# Patient Record
Sex: Female | Born: 1960 | Hispanic: No | Marital: Married | Smoking: Never smoker
Health system: Southern US, Community
[De-identification: ages and names within clinical notes are randomized; demographics above are authoritative.]

## PROBLEM LIST (undated history)

## (undated) DIAGNOSIS — I639 Cerebral infarction, unspecified: Secondary | ICD-10-CM

## (undated) DIAGNOSIS — E7800 Pure hypercholesterolemia, unspecified: Secondary | ICD-10-CM

## (undated) DIAGNOSIS — I10 Essential (primary) hypertension: Secondary | ICD-10-CM

## (undated) HISTORY — DX: Essential (primary) hypertension: I10

## (undated) HISTORY — DX: Cerebral infarction, unspecified: I63.9

## (undated) HISTORY — DX: Pure hypercholesterolemia, unspecified: E78.00

---

## 2017-09-29 LAB — HM PAP SMEAR: HM Pap smear: NEGATIVE

## 2018-10-24 LAB — HM MAMMOGRAPHY

## 2019-06-23 ENCOUNTER — Other Ambulatory Visit: Payer: Self-pay

## 2019-06-23 ENCOUNTER — Encounter: Payer: Self-pay | Admitting: Osteopathic Medicine

## 2019-06-23 ENCOUNTER — Ambulatory Visit (INDEPENDENT_AMBULATORY_CARE_PROVIDER_SITE_OTHER): Payer: BLUE CROSS/BLUE SHIELD | Admitting: Osteopathic Medicine

## 2019-06-23 VITALS — BP 139/74 | HR 73 | Temp 98.3°F | Ht <= 58 in | Wt 136.8 lb

## 2019-06-23 DIAGNOSIS — Z0000 Encounter for general adult medical examination without abnormal findings: Secondary | ICD-10-CM | POA: Diagnosis not present

## 2019-06-23 DIAGNOSIS — E782 Mixed hyperlipidemia: Secondary | ICD-10-CM | POA: Diagnosis not present

## 2019-06-23 DIAGNOSIS — R0781 Pleurodynia: Secondary | ICD-10-CM

## 2019-06-23 DIAGNOSIS — Z87898 Personal history of other specified conditions: Secondary | ICD-10-CM

## 2019-06-23 DIAGNOSIS — I517 Cardiomegaly: Secondary | ICD-10-CM

## 2019-06-23 DIAGNOSIS — I10 Essential (primary) hypertension: Secondary | ICD-10-CM | POA: Diagnosis not present

## 2019-06-23 MED ORDER — SIMVASTATIN 20 MG PO TABS: 20.0000 mg | ORAL_TABLET | Freq: Every day | ORAL | 3 refills | Status: DC

## 2019-06-23 MED ORDER — NAPROXEN 500 MG PO TABS: 500.0000 mg | ORAL_TABLET | Freq: Two times a day (BID) | ORAL | 1 refills | Status: DC

## 2019-06-23 MED ORDER — LISINOPRIL-HYDROCHLOROTHIAZIDE 20-12.5 MG PO TABS: 2.0000 | ORAL_TABLET | Freq: Every day | ORAL | 3 refills | Status: DC

## 2019-06-23 NOTE — Progress Notes (Signed)
HPI: Claire Marshall is a 58 y.o. female who  has a past medical history of High cholesterol and Hypertension.  she presents to [facility] today, 06/23/19,  for chief complaint of: New to establish care   HTN: Forgot medications today BP a bit above goal! Better on recheck 139/74 BP 10/03/18 per records was 150/80 and no changes made to meds (lisinopril-hct 20-12.5 mg take *2* tablets daily)  HLD: Forgot medications today  Taking simvastatin typically   Patient reports she suffered a fall in her kitchen a couple of days ago, slipped and fell on her side, is having some persistent left rib pain, no shortness of breath, no chest pain.  Patient also would like a referral to a cardiologist.  She states that she has a history of chest pain/palpitations although she is not suffering from any of this lately, she would just like to get set up with somebody.  My review of the records, she was seen by Dr. Cherlynn Polo last visit 06/12/2018 at [facility] cardiology, for history of left ventricular hypertrophy with history of what sounds like dizziness/presyncope, thought due to LVOT gradient.  Had been prescribed metoprolol but patient states that she has not been taking this.   Records reviewed from annual CPE w/ previous PCP 09/2018 1. CPE-  -Pt's medical, surgical, family and social history was updated at today's visit  -Screening labs were reviewed: CBC, CMP, Lipids -Pt was educated on breast self-exams. Due for Mammogram now, ordered in Epic -Colonoscopy was not ordered. Due in 2028 -Pap smear with HPV testing was not performed. Next Pap is due 2023 -Tdap/Flu shot : Tdap UTD, flu shot refused  2. HTN Above goal, continue current medications Encouraged on diet/exercise 3. Carotid artery calcification Continue ASA 81mg  and statin at current dosage Follow up in 6 months for HTN/Carotid artery calcification with CBC/CMP/Lipids   Mammo 10/24/18   BIRADS2 benign  Bilateral Carotid US 04/21/18  1.  Mild bilateral plaque, no hemodynamically significant stenosis on either side.   2.  Both vertebral arteries are patent with antegrade flow.  Colonoscopy 10/22/17 No report visible Notes indicate due 2028  Pap 09/27/2017 NILM, HPV neg     History was obtained with the help of an interpreter over the phone.  He did have some difficulty translating as well, the patient was speaking in long phrases without giving him a chance to respond, requiring multiple clarifications.     Past medical, surgical, social and family history reviewed:  Patient Active Problem List   Diagnosis Date Noted  . LVH (left ventricular hypertrophy) 06/25/2019  . History of palpitations 06/25/2019  . Rib pain 06/25/2019  . Mixed hyperlipidemia 06/25/2019  . Essential hypertension 06/25/2019   History reviewed. No pertinent surgical history.  Social History   Tobacco Use  . Smoking status: Never Smoker  . Smokeless tobacco: Never Used  Substance Use Topics  . Alcohol use: Not Currently    History reviewed. No pertinent family history.   Current medication list and allergy/intolerance information reviewed:    Current Outpatient Medications  Medication Sig Dispense Refill  . lisinopril-hydrochlorothiazide (ZESTORETIC) 20-12.5 MG tablet Take 2 tablets by mouth daily. 180 tablet 3  . simvastatin (ZOCOR) 20 MG tablet Take 1 tablet (20 mg total) by mouth daily. 90 tablet 3  . naproxen (NAPROSYN) 500 MG tablet Take 1 tablet (500 mg total) by mouth 2 (two) times daily with a meal. Take every day for one week, then use  as needed after that 60 tablet 1   No current facility-administered medications for this visit.     No Known Allergies    Review of Systems:  Constitutional:  No  fever, no chills, No recent illness,  HEENT: No  headache, no vision change  Cardiac: No  chest pain, No  pressure, +palpitations  Respiratory:  No  shortness of  breath. No  Cough  Gastrointestinal: No  abdominal pain, No  nausea  Musculoskeletal: +new myalgia/arthralgia  Skin: No  Rash  Neurologic: No  weakness, No  dizziness  Psychiatric: No  concerns with depression, No  concerns with anxiety  Exam:  BP 139/74 (BP Location: Left Arm, Patient Position: Sitting, Cuff Size: Normal)   Pulse 73   Temp 98.3 F (36.8 C) (Oral)   Ht 4\' 10"  (1.473 m)   Wt 136 lb 12.8 oz (62.1 kg)   BMI 28.59 kg/m   Constitutional: VS see above. General Appearance: alert, well-developed, well-nourished, NAD  Eyes: Normal lids and conjunctive, non-icteric sclera  Neck: No masses, trachea midline. No thyroid enlargement. No tenderness/mass appreciated. No lymphadenopathy  Respiratory: Normal respiratory effort. no wheeze, no rhonchi, no rales  Cardiovascular: S1/S2 normal, no murmur, no rub/gallop auscultated. RRR. No lower extremity edema.   Gastrointestinal: Nontender, no masses. No hepatomegaly, no splenomegaly. No hernia appreciated. Bowel sounds normal. Rectal exam deferred.   Musculoskeletal: Gait normal. No clubbing/cyanosis of digits.  Tenderness to palpation left lower ribs but no bruising/ecchymoses, no significant pain on deep inspiration  Neurological: Normal balance/coordination. No tremor.   Skin: warm, dry, intact. No rash/ulcer.  Psychiatric: Normal judgment/insight. Normal mood and affect. Oriented x3.      ASSESSMENT/PLAN: The primary encounter diagnosis was Essential hypertension. Diagnoses of Mixed hyperlipidemia, Annual physical exam, Rib pain, and History of palpitations were also pertinent to this visit.   Labs ordered for future visit. Annual physical / preventive care was NOT performed or billed today.   Patient advised via interpreter that she may have some mild bruising on her ribs but I do not see any signs of fracture, I would try anti-inflammatories.   It of a confusing history from the patient, I think probably  reasonable to have her follow-up with cardiology given that previous cardiologist wanted to see her back on an annual basis.  Sounds like there is some confusion over palpitations symptoms as well.  Orders Placed This Encounter  Procedures  . CBC  . COMPLETE METABOLIC PANEL WITH GFR  . Lipid panel  . Hemoglobin A1c  . TSH  . Ambulatory referral to Cardiology    Meds ordered this encounter  Medications  . lisinopril-hydrochlorothiazide (ZESTORETIC) 20-12.5 MG tablet    Sig: Take 2 tablets by mouth daily.    Dispense:  180 tablet    Refill:  3  . simvastatin (ZOCOR) 20 MG tablet    Sig: Take 1 tablet (20 mg total) by mouth daily.    Dispense:  90 tablet    Refill:  3  . naproxen (NAPROSYN) 500 MG tablet    Sig: Take 1 tablet (500 mg total) by mouth 2 (two) times daily with a meal. Take every day for one week, then use as needed after that    Dispense:  60 tablet    Refill:  1        Visit summary with medication list and pertinent instructions was printed for patient to review. All questions at time of visit were answered - patient instructed to contact office  with any additional concerns or updates. ER/RTC precautions were reviewed with the patient.   Note: Total time spent 60 minutes, greater than 50% of the visit was spent face-to-face counseling and coordinating care for the above diagnoses listed in assessment/plan.   Please note: voice recognition software was used to produce this document, and typos may escape review. Please contact Dr. Lyn HollingsheadAlexander for any needed clarifications.     Follow-up plan: Return in about 3 months (around 09/23/2019) for [place] (labs prior to visit, orders are in).

## 2019-06-25 ENCOUNTER — Encounter: Payer: Self-pay | Admitting: Osteopathic Medicine

## 2019-06-25 DIAGNOSIS — Z87898 Personal history of other specified conditions: Secondary | ICD-10-CM | POA: Insufficient documentation

## 2019-06-25 DIAGNOSIS — I517 Cardiomegaly: Secondary | ICD-10-CM | POA: Insufficient documentation

## 2019-06-25 DIAGNOSIS — E782 Mixed hyperlipidemia: Secondary | ICD-10-CM | POA: Insufficient documentation

## 2019-06-25 DIAGNOSIS — R0781 Pleurodynia: Secondary | ICD-10-CM | POA: Insufficient documentation

## 2019-06-25 DIAGNOSIS — R0789 Other chest pain: Secondary | ICD-10-CM | POA: Insufficient documentation

## 2019-06-25 DIAGNOSIS — I10 Essential (primary) hypertension: Secondary | ICD-10-CM | POA: Insufficient documentation

## 2019-06-25 NOTE — Progress Notes (Unsigned)
Negative for intraepithelial lesion or malignancy.  

## 2019-09-09 ENCOUNTER — Emergency Department (INDEPENDENT_AMBULATORY_CARE_PROVIDER_SITE_OTHER)
Admission: EM | Admit: 2019-09-09 | Discharge: 2019-09-09 | Disposition: A | Discharge status: Home / Self Care | Payer: BLUE CROSS/BLUE SHIELD | Source: Home / Self Care

## 2019-09-09 ENCOUNTER — Other Ambulatory Visit: Payer: Self-pay

## 2019-09-09 ENCOUNTER — Telehealth: Payer: Self-pay | Admitting: Osteopathic Medicine

## 2019-09-09 DIAGNOSIS — R4702 Dysphasia: Secondary | ICD-10-CM

## 2019-09-09 DIAGNOSIS — R42 Dizziness and giddiness: Secondary | ICD-10-CM | POA: Diagnosis not present

## 2019-09-09 DIAGNOSIS — R410 Disorientation, unspecified: Secondary | ICD-10-CM

## 2019-09-09 NOTE — ED Provider Notes (Signed)
[facility]    CSN: [id number] Arrival date & time: 09/09/19  1751      History   Chief Complaint Chief Complaint  Patient presents with  . Dizziness    HPI Jazmine Heckman is a 58 y.o. female.   HPI  Aneesah Hernan is a 58 y.o. female presenting to UC with c/o intermittent dizziness every morning with ringing in her ears the last 10 days.  Today, around lunch time, pt states she was unable to speak for about 1-2 hours. Her son called her PCP who recommended she seek urgent medical care.  Pt continued completing errands this afternoon.  Pt's son drove her to UC and is concerned she may have had a TIA. Her son recalls pt being diagnosed with clogged carotid arteries and knows that can increase her risk of stroke.  Pt denies having symptoms at this time besides mild ringing in her Left ear.  Denies HA, dizziness, change in vision, weakness or numbness in arms or legs. Denies change in balance. Son confirms pt is no longer confused and has normal speech at this time.    Past Medical History:  Diagnosis Date  . High cholesterol   . Hypertension     Patient Active Problem List   Diagnosis Date Noted  . LVH (left ventricular hypertrophy) 06/25/2019  . History of palpitations 06/25/2019  . Rib pain 06/25/2019  . Mixed hyperlipidemia 06/25/2019  . Essential hypertension 06/25/2019    No past surgical history on file.  OB History   No obstetric history on file.      Home Medications    Prior to Admission medications   Medication Sig Start Date End Date Taking? Authorizing Provider  lisinopril-hydrochlorothiazide (ZESTORETIC) 20-12.5 MG tablet Take 2 tablets by mouth daily. 06/23/19   Emeterio Reeve, DO  naproxen (NAPROSYN) 500 MG tablet Take 1 tablet (500 mg total) by mouth 2 (two) times daily with a meal. Take every day for one week, then use as needed after that 06/23/19   Emeterio Reeve, DO  simvastatin (ZOCOR) 20 MG tablet Take 1 tablet (20 mg total) by mouth  daily. 06/23/19   Emeterio Reeve, DO    Family History No family history on file.  Social History Social History   Tobacco Use  . Smoking status: Never Smoker  . Smokeless tobacco: Never Used  Substance Use Topics  . Alcohol use: Not Currently  . Drug use: Never     Allergies   Patient has no known allergies.   Review of Systems Review of Systems  HENT: Positive for tinnitus (Left). Negative for congestion.   Eyes: Negative for photophobia and visual disturbance.  Gastrointestinal: Negative for nausea and vomiting.  Neurological: Positive for dizziness and speech difficulty. Negative for syncope, facial asymmetry, weakness, light-headedness, numbness and headaches.     Physical Exam Triage Vital Signs ED Triage Vitals  Enc Vitals Group     BP 09/09/19 1821 (!) 145/80     Pulse Rate 09/09/19 1821 71     Resp --      Temp 09/09/19 1821 98.5 F (36.9 C)     Temp Source 09/09/19 1821 Oral     SpO2 09/09/19 1821 99 %     Weight 09/09/19 1822 135 lb (61.2 kg)     Height 09/09/19 1822 5' (1.524 m)     Head Circumference --      Peak Flow --      Pain Score 09/09/19 1821 0  Pain Loc --      Pain Edu? --      Excl. in GC? --    Orthostatic VS for the past 24 hrs:  BP- Lying Pulse- Lying BP- Sitting Pulse- Sitting BP- Standing at 0 minutes Pulse- Standing at 0 minutes  09/09/19 1826 139/82 63 144/85 68 150/78 74    Updated Vital Signs BP (!) 145/80 (BP Location: Right Arm)   Pulse 71   Temp 98.5 F (36.9 C) (Oral)   Ht 5' (1.524 m)   Wt 135 lb (61.2 kg)   SpO2 99%   BMI 26.37 kg/m   Visual Acuity Right Eye Distance:   Left Eye Distance:   Bilateral Distance:    Right Eye Near:   Left Eye Near:    Bilateral Near:     Physical Exam Vitals signs and nursing note reviewed.  Constitutional:      General: She is not in acute distress.    Appearance: Normal appearance. She is well-developed. She is not ill-appearing, toxic-appearing or diaphoretic.   HENT:     Head: Normocephalic and atraumatic.     Right Ear: Tympanic membrane and ear canal normal.     Left Ear: Tympanic membrane and ear canal normal.     Nose: Nose normal.     Mouth/Throat:     Mouth: Mucous membranes are moist.  Eyes:     Extraocular Movements: Extraocular movements intact.     Conjunctiva/sclera: Conjunctivae normal.     Pupils: Pupils are equal, round, and reactive to light.  Neck:     Musculoskeletal: Normal range of motion.  Cardiovascular:     Rate and Rhythm: Normal rate and regular rhythm.  Pulmonary:     Effort: Pulmonary effort is normal.     Breath sounds: Normal breath sounds.  Musculoskeletal: Normal range of motion.  Skin:    General: Skin is warm and dry.     Capillary Refill: Capillary refill takes less than 2 seconds.  Neurological:     General: No focal deficit present.     Mental Status: She is alert and oriented to person, place, and time.     Cranial Nerves: No cranial nerve deficit.     Sensory: No sensory deficit.     Motor: No weakness.     Coordination: Coordination normal.     Gait: Gait normal.  Psychiatric:        Behavior: Behavior normal.      UC Treatments / Results  Labs (all labs ordered are listed, but only abnormal results are displayed) Labs Reviewed - No data to display  EKG   Radiology No results found.  Procedures Procedures (including critical care time)  Medications Ordered in UC Medications - No data to display  Initial Impression / Assessment and Plan / UC Course  I have reviewed the triage vital signs and the nursing notes.  Pertinent labs & imaging results that were available during my care of the patient were reviewed by me and considered in my medical decision making (see chart for details).     Normal neuro exam in UC, however, hx concerning for TIA. Recommend further w/o in emergency department. Pt's son agreeable to drive pt to hospital.  Declined EMS transport.  due to pt being  asymptomatic at this time, agreeable pt is safe to ride with her son.  Final Clinical Impressions(s) / UC Diagnoses   Final diagnoses:  Dizziness  Dysphasia  Transient confusion     Discharge  Instructions      There is concern you had a mini stroke or a TIA, it is recommended you seek further evaluation at the hospital. You have declined EMS transport. Please have your son drive you directly to the hospital for further evaluation and treatment.     ED Prescriptions    None     PDMP not reviewed this encounter.   Lurene Shadow, PA-C 09/09/19 2001

## 2019-09-09 NOTE — ED Triage Notes (Signed)
Dizziness every morning, ringing in he ears 10 days. Today at lunch unable to speak for about 1.5 hours

## 2019-09-09 NOTE — Telephone Encounter (Signed)
Son called in stating that patient had difficulty getting words out, tough to talk, and was confused for about two hours. This happened earlier at work and now patient is out and about running errands per son. Son states that her spoke to patient and patient states she is fine but he is very concerned and wants her to be seen tomorrow. Per triage nurse patient is to seek Urgent care. Son voiced understanding and no further questions at this time.

## 2019-09-09 NOTE — Discharge Instructions (Signed)
°  There is concern you had a mini stroke or a TIA, it is recommended you seek further evaluation at the hospital. You have declined EMS transport. Please have your son drive you directly to the hospital for further evaluation and treatment.

## 2019-09-10 MED ORDER — ATORVASTATIN CALCIUM 40 MG PO TABS
40.00 | ORAL_TABLET | ORAL | Status: DC
Start: 2019-09-10 — End: 2019-09-10

## 2019-09-10 MED ORDER — GENERIC EXTERNAL MEDICATION
Status: DC
Start: 2019-09-11 — End: 2019-09-10

## 2019-09-10 MED ORDER — LISINOPRIL 5 MG PO TABS
10.00 | ORAL_TABLET | ORAL | Status: DC
Start: 2019-09-10 — End: 2019-09-10

## 2019-09-10 NOTE — Telephone Encounter (Signed)
Spoke to son and son stated that mother was going to be discharged in 1-2 hours. Informed son to call us back with information on when mother needed to follow up with [place] and hospital details. Son voiced understanding and no further questions at this time.

## 2019-09-10 NOTE — Telephone Encounter (Signed)
Patient did go to urgent care and was ultimately seen in the emergency department.

## 2019-09-24 ENCOUNTER — Ambulatory Visit (INDEPENDENT_AMBULATORY_CARE_PROVIDER_SITE_OTHER): Payer: BLUE CROSS/BLUE SHIELD | Admitting: Osteopathic Medicine

## 2019-09-24 ENCOUNTER — Encounter: Payer: Self-pay | Admitting: Osteopathic Medicine

## 2019-09-24 ENCOUNTER — Other Ambulatory Visit: Payer: Self-pay

## 2019-09-24 VITALS — BP 118/58 | HR 70 | Temp 97.8°F | Wt 141.5 lb

## 2019-09-24 DIAGNOSIS — I639 Cerebral infarction, unspecified: Secondary | ICD-10-CM | POA: Diagnosis not present

## 2019-09-24 DIAGNOSIS — Z87898 Personal history of other specified conditions: Secondary | ICD-10-CM

## 2019-09-24 DIAGNOSIS — H832X9 Labyrinthine dysfunction, unspecified ear: Secondary | ICD-10-CM

## 2019-09-24 DIAGNOSIS — I10 Essential (primary) hypertension: Secondary | ICD-10-CM

## 2019-09-24 NOTE — Progress Notes (Signed)
HPI: Claire Marshall is a 58 y.o. female who  has a past medical history of High cholesterol and Hypertension.  she presents to [facility] today, 09/24/19, for hospital follow up   Hospital Follow up for CVA - Patient was admitted to [facility] on 9/23 for difficulty speaking, headache, and dizziness. 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 She was found not to have any focal neuro deficits and was discharged on 9/24 on ASA 81 mg, Plavix 75 mg, and Lipitor 40 mg for stroke prevention and PRN meclizine for vertigo as well as a ZIO patch for 2 weeks to rule out atrial fibrillation. Since discharge she has been feeling well and compliant with medications. Reports 1x episode of nausea last Friday that resolved after taking BP. Denies headache, dizziness, chest pain, shortness of breath. She has pressed her ZIO patch 2x for palpitations.    Patient is accompanied by son, Claire Marshall who assists with interpretation & history-taking.   Past medical, surgical, social and family history reviewed:  Patient Active Problem List   Diagnosis Date Noted  . LVH (left ventricular hypertrophy) 06/25/2019  . History of palpitations 06/25/2019  . Rib pain 06/25/2019  . Mixed hyperlipidemia 06/25/2019  . Essential hypertension 06/25/2019    No past surgical history on file.  Social History   Tobacco Use  . Smoking status: Never Smoker  . Smokeless tobacco: Never Used  Substance Use Topics  . Alcohol use: Not Currently    No family history on file.   Current medication list and allergy/intolerance information reviewed:    Current Outpatient Medications  Medication Sig Dispense Refill  . aspirin EC 81 MG tablet Take by mouth.    Marland Kitchen atorvastatin (LIPITOR) 40 MG tablet TAKE 1 TABLET BY MOUTH ONCE DAILY AT 6PM    . clopidogrel (PLAVIX) 75 MG tablet Take by mouth.    Marland Kitchen lisinopril-hydrochlorothiazide  (ZESTORETIC) 20-12.5 MG tablet Take 2 tablets by mouth daily. 180 tablet 3  . meclizine (ANTIVERT) 25 MG tablet Take by mouth.    . naproxen (NAPROSYN) 500 MG tablet Take 1 tablet (500 mg total) by mouth 2 (two) times daily with a meal. Take every day for one week, then use as needed after that 60 tablet 1  . simvastatin (ZOCOR) 20 MG tablet Take 1 tablet (20 mg total) by mouth daily. (Patient not taking: Reported on 09/24/2019) 90 tablet 3   No current facility-administered medications for this visit.     No Known Allergies    Review of Systems:  Constitutional: No recent illness.   HEENT: No  headache, no vision change  Cardiac: Palpitations per HPI. No  chest pain, No  pressure  Respiratory:  No  shortness of breath.   Gastrointestinal: Nausea per HPI. No  vomiting  Neurologic: No  weakness, +occasional dizziness, No  slurred speech/focal weakness/facial droop  Exam:  BP (!) 118/58 (BP Location: Left Arm, Patient Position: Sitting, Cuff Size: Normal)   Pulse 70   Temp 97.8 F (36.6 C) (Oral)   Wt 141 lb 8 oz (64.2 kg)   BMI 27.63 kg/m   Constitutional: VS see above. General Appearance: alert, well-developed, well-nourished, NAD  Eyes: Normal lids and conjunctive, non-icteric sclera  Neck: No masses, trachea midline.  No lymphadenopathy  Respiratory: Normal respiratory effort. no wheeze, no rhonchi, no rales  Cardiovascular: S1/S2 normal, no murmur, no rub/gallop auscultated. RRR. No lower extremity edema.  No carotid bruit or JVD.   Neurological: Normal balance/coordination. No tremor. No cranial nerve deficit on limited exam. Intact finger-to-nose. Motor and sensation intact and symmetric.   Skin: warm, dry, intact.  Psychiatric: Normal judgment/insight. Normal mood and affect. Oriented x3.    No results found for this or any previous visit (from the past 72 hour(s)).  No results found.      ASSESSMENT/PLAN: The primary encounter diagnosis was  Cerebrovascular accident (CVA), unspecified mechanism ([place]). Diagnoses of Essential hypertension and History of palpitations were also pertinent to this visit.   Claire Marshall is a 58 y.o. female who is being seen for hospital follow up of an Acute Ischemic Stroke. Patient is doing well since discharge. No focal neurologic deficits on exam today. Will continue discharge medications. Will refer patient to [facility] for follow up. Plan to follow up in 1 month for annual visit and recheck labs.   Patient was referred on hospital discharge but she cannot remember where she had any appointment, will go ahead and refer locally.  Orders Placed This Encounter  Procedures  . CBC  . COMPLETE METABOLIC PANEL WITH GFR  . Lipid panel  . Ambulatory referral to Neurology  . Ambulatory referral to Physical Therapy    Meds ordered this encounter  Medications  . aspirin EC 81 MG tablet    Sig: Take 1 tablet (81 mg total) by mouth daily.    Dispense:  90 tablet    Refill:  3  . atorvastatin (LIPITOR) 40 MG tablet    Sig: Take 1 tablet (40 mg total) by mouth daily.    Dispense:  90 tablet    Refill:  3  . clopidogrel (PLAVIX) 75 MG tablet    Sig: Take 1 tablet (75 mg total) by mouth daily for 21 days.    Dispense:  90 tablet    Refill:  1  . lisinopril-hydrochlorothiazide (ZESTORETIC) 20-12.5 MG tablet    Sig: Take 2 tablets by mouth daily.    Dispense:  180 tablet    Refill:  3          Visit summary with medication list and pertinent instructions was printed for patient to review. All questions at time of visit were answered - patient instructed to contact office with any additional concerns or updates. ER/RTC precautions were reviewed with the patient.   Note: Total time spent 25 minutes, greater than 50% of the visit was spent face-to-face counseling and coordinating care for the above diagnoses listed in assessment/plan.   Please note: voice recognition  software was used to produce this document, and typos may escape review. Please contact Dr. Sheppard Coil for any needed clarifications.     Follow-up plan: Return in about 4 weeks (around 10/22/2019) for ANNUAL (get blood work done before visit, orders are in) - FYI to [employer], interpreter .

## 2019-09-25 DIAGNOSIS — I639 Cerebral infarction, unspecified: Secondary | ICD-10-CM | POA: Insufficient documentation

## 2019-09-25 MED ORDER — LISINOPRIL-HYDROCHLOROTHIAZIDE 20-12.5 MG PO TABS: 2.0000 | ORAL_TABLET | Freq: Every day | ORAL | 3 refills | Status: DC

## 2019-09-25 MED ORDER — CLOPIDOGREL BISULFATE 75 MG PO TABS: 75.0000 mg | ORAL_TABLET | Freq: Every day | ORAL | 1 refills | Status: AC

## 2019-09-25 MED ORDER — ASPIRIN EC 81 MG PO TBEC: 81.0000 mg | DELAYED_RELEASE_TABLET | Freq: Every day | ORAL | 3 refills | Status: AC

## 2019-09-25 MED ORDER — ATORVASTATIN CALCIUM 40 MG PO TABS: 40.0000 mg | ORAL_TABLET | Freq: Every day | ORAL | 3 refills | Status: AC

## 2019-10-28 ENCOUNTER — Ambulatory Visit: Payer: BLUE CROSS/BLUE SHIELD | Admitting: Osteopathic Medicine

## 2019-11-11 ENCOUNTER — Ambulatory Visit: Payer: BLUE CROSS/BLUE SHIELD | Admitting: Osteopathic Medicine

## 2019-11-25 ENCOUNTER — Other Ambulatory Visit: Payer: Self-pay | Admitting: Osteopathic Medicine

## 2019-11-25 NOTE — Telephone Encounter (Signed)
Requested medication (s) are due for refill today: yes  Requested medication (s) are on the active medication list: yes  Last refill:  07/21/2019  Future visit scheduled: yes  Notes to clinic:  Review for refill   Requested Prescriptions  Pending Prescriptions Disp Refills   naproxen (NAPROSYN) 500 MG tablet [Pharmacy Med Name: Naproxen 500 MG Oral Tablet] 60 tablet 0    Sig: TAKE 1 TABLET BY MOUTH TWICE DAILY WITH A MEAL. TAKE EVERY DAY FOR ONE WEEK, THEN USE AS NEEDED AFTER THAT.     Analgesics:  NSAIDS Failed - 11/25/2019 12:37 PM      Failed - Cr in normal range and within 360 days    No results found for: CREATININE       Failed - HGB in normal range and within 360 days    No results found for: HGB, HGBKUC, [place] - Patient is not pregnant      Passed - Valid encounter within last 12 months    Recent Outpatient Visits          2 months ago Cerebrovascular accident (CVA), unspecified mechanism ([place])   [facility], Lanelle Bal, DO   5 months ago Essential hypertension   [place], Lanelle Bal, DO      Future Appointments            In 1 month Emeterio Reeve, [facility]

## 2019-12-03 ENCOUNTER — Ambulatory Visit: Payer: BLUE CROSS/BLUE SHIELD | Admitting: Neurology

## 2019-12-15 ENCOUNTER — Ambulatory Visit: Payer: Self-pay | Admitting: Neurology

## 2019-12-15 ENCOUNTER — Telehealth: Payer: Self-pay

## 2019-12-15 NOTE — Telephone Encounter (Signed)
Patient no show for appointment. Interpreter waited till 110 for pt.

## 2019-12-16 ENCOUNTER — Encounter: Payer: Self-pay | Admitting: Neurology

## 2020-01-04 ENCOUNTER — Other Ambulatory Visit: Payer: Self-pay

## 2020-01-04 ENCOUNTER — Ambulatory Visit (INDEPENDENT_AMBULATORY_CARE_PROVIDER_SITE_OTHER): Payer: BLUE CROSS/BLUE SHIELD | Admitting: Osteopathic Medicine

## 2020-01-04 ENCOUNTER — Ambulatory Visit (INDEPENDENT_AMBULATORY_CARE_PROVIDER_SITE_OTHER): Payer: BLUE CROSS/BLUE SHIELD

## 2020-01-04 ENCOUNTER — Encounter: Payer: Self-pay | Admitting: Osteopathic Medicine

## 2020-01-04 VITALS — BP 130/66 | HR 82 | Temp 97.5°F

## 2020-01-04 DIAGNOSIS — E785 Hyperlipidemia, unspecified: Secondary | ICD-10-CM

## 2020-01-04 DIAGNOSIS — I10 Essential (primary) hypertension: Secondary | ICD-10-CM | POA: Diagnosis not present

## 2020-01-04 DIAGNOSIS — S92001A Unspecified fracture of right calcaneus, initial encounter for closed fracture: Secondary | ICD-10-CM

## 2020-01-04 DIAGNOSIS — R7309 Other abnormal glucose: Secondary | ICD-10-CM | POA: Diagnosis not present

## 2020-01-04 DIAGNOSIS — Z8673 Personal history of transient ischemic attack (TIA), and cerebral infarction without residual deficits: Secondary | ICD-10-CM
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
## 2020-01-11 ENCOUNTER — Ambulatory Visit (INDEPENDENT_AMBULATORY_CARE_PROVIDER_SITE_OTHER): Payer: BLUE CROSS/BLUE SHIELD | Admitting: Sports Medicine

## 2020-01-11 ENCOUNTER — Institutional Professional Consult (permissible substitution): Payer: BLUE CROSS/BLUE SHIELD | Admitting: Sports Medicine

## 2020-01-11 ENCOUNTER — Other Ambulatory Visit: Payer: Self-pay

## 2020-01-11 ENCOUNTER — Ambulatory Visit (INDEPENDENT_AMBULATORY_CARE_PROVIDER_SITE_OTHER): Payer: BLUE CROSS/BLUE SHIELD

## 2020-01-11 ENCOUNTER — Other Ambulatory Visit: Payer: Self-pay | Admitting: Sports Medicine

## 2020-01-11 DIAGNOSIS — S92901A Unspecified fracture of right foot, initial encounter for closed fracture: Secondary | ICD-10-CM | POA: Diagnosis not present

## 2020-01-11 DIAGNOSIS — S92301D Fracture of unspecified metatarsal bone(s), right foot, subsequent encounter for fracture with routine healing: Secondary | ICD-10-CM | POA: Diagnosis not present

## 2020-01-11 DIAGNOSIS — S92001D Unspecified fracture of right calcaneus, subsequent encounter for fracture with routine healing: Secondary | ICD-10-CM

## 2020-01-11 DIAGNOSIS — S92001A Unspecified fracture of right calcaneus, initial encounter for closed fracture: Secondary | ICD-10-CM | POA: Insufficient documentation

## 2020-01-11 DIAGNOSIS — S92301A Fracture of unspecified metatarsal bone(s), right foot, initial encounter for closed fracture: Secondary | ICD-10-CM | POA: Insufficient documentation

## 2020-01-11 MED ORDER — HYDROCODONE-ACETAMINOPHEN 10-325 MG PO TABS: 1.0000 | ORAL_TABLET | ORAL | 0 refills | Status: DC | PRN

## 2020-01-11 NOTE — Assessment & Plan Note (Signed)
There also do appear to be fractures of the third and fifth metatarsal shafts proximally, CT scan will further delineate this as well, continue boot, she does need to be exclusively nonweightbearing. Return to see me in 2 weeks.
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
## 2020-01-12 ENCOUNTER — Ambulatory Visit (INDEPENDENT_AMBULATORY_CARE_PROVIDER_SITE_OTHER): Payer: BLUE CROSS/BLUE SHIELD

## 2020-01-12 DIAGNOSIS — S92901A Unspecified fracture of right foot, initial encounter for closed fracture: Secondary | ICD-10-CM

## 2020-01-12 DIAGNOSIS — S92001D Unspecified fracture of right calcaneus, subsequent encounter for fracture with routine healing: Secondary | ICD-10-CM

## 2020-01-25 ENCOUNTER — Other Ambulatory Visit: Payer: Self-pay

## 2020-01-25 ENCOUNTER — Ambulatory Visit (INDEPENDENT_AMBULATORY_CARE_PROVIDER_SITE_OTHER): Payer: BLUE CROSS/BLUE SHIELD | Admitting: Sports Medicine

## 2020-01-25 ENCOUNTER — Encounter: Payer: Self-pay | Admitting: Sports Medicine

## 2020-01-25 DIAGNOSIS — S92001D Unspecified fracture of right calcaneus, subsequent encounter for fracture with routine healing: Secondary | ICD-10-CM | POA: Diagnosis not present
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
## 2020-02-22 ENCOUNTER — Ambulatory Visit: Payer: BLUE CROSS/BLUE SHIELD | Admitting: Sports Medicine

## 2020-03-17 ENCOUNTER — Other Ambulatory Visit: Payer: Self-pay

## 2020-03-17 ENCOUNTER — Ambulatory Visit (INDEPENDENT_AMBULATORY_CARE_PROVIDER_SITE_OTHER): Payer: BLUE CROSS/BLUE SHIELD

## 2020-03-17 ENCOUNTER — Ambulatory Visit (INDEPENDENT_AMBULATORY_CARE_PROVIDER_SITE_OTHER): Payer: BLUE CROSS/BLUE SHIELD | Admitting: Sports Medicine

## 2020-03-17 DIAGNOSIS — S92001D Unspecified fracture of right calcaneus, subsequent encounter for fracture with routine healing: Secondary | ICD-10-CM
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
## 2020-03-29 ENCOUNTER — Ambulatory Visit (INDEPENDENT_AMBULATORY_CARE_PROVIDER_SITE_OTHER): Payer: BLUE CROSS/BLUE SHIELD | Admitting: Osteopathic Medicine

## 2020-03-29 ENCOUNTER — Other Ambulatory Visit: Payer: Self-pay

## 2020-03-29 ENCOUNTER — Ambulatory Visit (INDEPENDENT_AMBULATORY_CARE_PROVIDER_SITE_OTHER): Payer: BLUE CROSS/BLUE SHIELD

## 2020-03-29 ENCOUNTER — Encounter: Payer: Self-pay | Admitting: Osteopathic Medicine

## 2020-03-29 VITALS — BP 174/94 | HR 74 | Wt 140.0 lb

## 2020-03-29 DIAGNOSIS — R200 Anesthesia of skin: Secondary | ICD-10-CM

## 2020-03-29 DIAGNOSIS — Z87898 Personal history of other specified conditions: Secondary | ICD-10-CM | POA: Diagnosis not present

## 2020-03-29 DIAGNOSIS — I10 Essential (primary) hypertension: Secondary | ICD-10-CM

## 2020-03-29 DIAGNOSIS — M5030 Other cervical disc degeneration, unspecified cervical region: Secondary | ICD-10-CM

## 2020-03-29 DIAGNOSIS — Z8673 Personal history of transient ischemic attack (TIA), and cerebral infarction without residual deficits: Secondary | ICD-10-CM | POA: Diagnosis not present

## 2020-03-29 DIAGNOSIS — I517 Cardiomegaly: Secondary | ICD-10-CM

## 2020-03-29 DIAGNOSIS — R202 Paresthesia of skin: Secondary | ICD-10-CM

## 2020-03-29 NOTE — Patient Instructions (Addendum)
We do not need to continue Plavix. Please stay on the aspirin, atorvastatin, and blood pressure medications.  I placed a referral today for cardiologist.  You should hear back from that office soon.  Blood pressure today is high. Please return to clinic to recheck this in a few days, since your blood pressure has been good before. If still high, will add a medication.   Labs today.  Will also schedule MRI of brain and neck. MRI of neck will not get covered by insurance without Xray first, so Xray neck today.   If you experience any symptoms concerning for a stroke, please go to the emergency department at the closest hospital, or call 911!  If you do develop a serious stroke, this is something that needs to be treated in the hospital!

## 2020-03-29 NOTE — Progress Notes (Signed)
Claire Marshall is a 59 y.o. female who presents to  [place] at [facility]  today, 03/29/20, seeking care for the following: . Patient had some concerns about her medications, whether or not to be on Plavix . Request update on cardiology referral . Patient also tells me that she is having some left arm numbness/weakness/tingling 2 episodes over the past 2 weeks and she is very anxious that she is having another stroke, reports some associated neck pain on occasion, no other focal deficits . Request cardiology f/u - previously at [facility] wants to change to [facility] hx LVH, palpitations  . Son helps translate over the phone, PT speaks decent but not fluent [place], declined interpreter      [place] with other pertinent history/findings:  The primary encounter diagnosis was History of ischemic stroke. Diagnoses of Essential hypertension, LVH (left ventricular hypertrophy), History of palpitations, Numbness and tingling in left arm, and Other cervical disc degeneration, unspecified cervical region were also pertinent to this visit.   See pt instructions below  Re: weakness/tingling - Ddx seems more like MSK radiculopathy as opposed to CVA/TIA type symptoms but given history let's get imaging. No concerns on neuro exam (normal symmetric strength and sensation all extremities, normal cognition, PERRL/EOMI, CN WNL on limited exam)   If BP still elevated, would add Metoprolol ER 25 mg daily and recheck again 1 week, titrate up as needed, son will also check her BP at home  Orders Placed This Encounter  Procedures  . MR Brain W Wo Contrast  . MR Cervical Spine Wo Contrast  . DG CERVICAL SPINE 2 VIEW  . DG Cervical Spine 2 or 3 views  . CBC  . COMPLETE METABOLIC PANEL WITH GFR  . Lipid panel  . Hemoglobin A1c  . Ambulatory referral to Cardiology  . EKG 12-Lead   EKG interpretation: Rate: 67 Rhythm: sinus No ST/T changes concerning for  acute ischemia/infarct  Previous EKG no tracings available to review    Patient Instructions  We do not need to continue Plavix. Please stay on the aspirin, atorvastatin, and blood pressure medications.  I placed a referral today for cardiologist.  You should hear back from that office soon.  Blood pressure today is high. Please return to clinic to recheck this in a few days, since your blood pressure has been good before. If still high, will add a medication.   Labs today.  Will also schedule MRI of brain and neck. MRI of neck will not get covered by insurance without Xray first, so Xray neck today.   If you experience any symptoms concerning for a stroke, please go to the emergency department at the closest hospital, or call 911!  If you do develop a serious stroke, this is something that needs to be treated in the hospital!     (reviewed above w/ patient and son, who speaks ENglish)       TIA 09/09/2019, following with neurology. Presenting symptoms were felt to be most likely secondary to left frontal centrum ovale. CVA of unknown etiology but suspected cardioembolic. The patient was started on ASA 81, Plavix 75 and Lipitor 40 for future stroke prevention which should be continued upon discharge. As of tele-health visit w/ neurology 10/14/2019, advised continue Plavix and aspirin 81 x 90 days then can resume aspirin monotherapy. Advised f/u in 3 mos (around 01/14/2020 - AVS was mailed to patient).   Pt also concerned about cardiology re: palpitations, I  sent referral 06/2019    No orders of the defined types were placed in this encounter.     BP Readings from Last 3 Encounters:  03/29/20 (!) 174/94  01/04/20 130/66  09/24/19 (!) 118/58     Follow-up instructions: Return in about 3 days (around 04/01/2020) for NURSE VISIT BP CHECK .                                         BP (!) 174/94 (BP Location: Right Arm, Patient Position:  Supine, Cuff Size: Small)   Pulse 74   Wt 140 lb (63.5 kg)   SpO2 99%   BMI 27.34 kg/m   Current Meds  Medication Sig  . aspirin EC 81 MG tablet Take 1 tablet (81 mg total) by mouth daily.  Marland Kitchen atorvastatin (LIPITOR) 40 MG tablet Take 1 tablet (40 mg total) by mouth daily.  Marland Kitchen lisinopril-hydrochlorothiazide (ZESTORETIC) 20-12.5 MG tablet Take 2 tablets by mouth daily.  . naproxen (NAPROSYN) 500 MG tablet TAKE 1 TABLET BY MOUTH TWICE DAILY WITH A MEAL. TAKE EVERY DAY FOR ONE WEEK, THEN USE AS NEEDED AFTER THAT.  . [DISCONTINUED] HYDROcodone-acetaminophen (NORCO) 10-325 MG tablet Take 1 tablet by mouth every 4 (four) hours as needed.    No results found for this or any previous visit (from the past 72 hour(s)).  No results found.   All questions at time of visit were answered - patient instructed to contact office with any additional concerns or updates.  ER/RTC precautions were reviewed with the patient.  Please note: voice recognition software was used to produce this document, and typos may escape review. Please contact Dr. Lyn Hollingshead for any needed clarifications.   Total encounter time: 40 minutes.

## 2020-03-30 LAB — CBC
HCT: 44.5 % (ref 35.0–45.0)
Hemoglobin: 14.7 g/dL (ref 11.7–15.5)
MCH: 28 pg (ref 27.0–33.0)
MCHC: 33 g/dL (ref 32.0–36.0)
MCV: 84.8 fL (ref 80.0–100.0)
MPV: 9.9 fL (ref 7.5–12.5)
Platelets: 356 10*3/uL (ref 140–400)
RBC: 5.25 10*6/uL — ABNORMAL HIGH (ref 3.80–5.10)
RDW: 12.9 % (ref 11.0–15.0)
WBC: 8 10*3/uL (ref 3.8–10.8)

## 2020-03-30 LAB — COMPLETE METABOLIC PANEL WITH GFR
AG Ratio: 1.4 (calc) (ref 1.0–2.5)
ALT: 32 U/L — ABNORMAL HIGH (ref 6–29)
AST: 23 U/L (ref 10–35)
Albumin: 4.2 g/dL (ref 3.6–5.1)
Alkaline phosphatase (APISO): 88 U/L (ref 37–153)
BUN: 11 mg/dL (ref 7–25)
CO2: 29 mmol/L (ref 20–32)
Calcium: 9.6 mg/dL (ref 8.6–10.4)
Chloride: 105 mmol/L (ref 98–110)
Creat: 0.52 mg/dL (ref 0.50–0.99)
GFR, Est African American: 120 mL/min/{1.73_m2} (ref >=60)
GFR, Est Non African American: 104 mL/min/{1.73_m2} (ref >=60)
Globulin: 3 g/dL (calc) (ref 1.9–3.7)
Glucose, Bld: 98 mg/dL (ref 65–99)
Potassium: 4.5 mmol/L (ref 3.5–5.3)
Sodium: 142 mmol/L (ref 135–146)
Total Bilirubin: 0.4 mg/dL (ref 0.2–1.2)
Total Protein: 7.2 g/dL (ref 6.1–8.1)

## 2020-03-30 LAB — HEMOGLOBIN A1C
Hgb A1c MFr Bld: 5.7 % of total Hgb — ABNORMAL HIGH (ref <5.7)
Mean Plasma Glucose: 117 (calc)
eAG (mmol/L): 6.5 (calc)

## 2020-03-30 LAB — LIPID PANEL
Cholesterol: 149 mg/dL (ref <200)
HDL: 43 mg/dL — ABNORMAL LOW (ref >=50)
LDL Cholesterol (Calc): 82 mg/dL (calc)
Non-HDL Cholesterol (Calc): 106 mg/dL (calc) (ref <130)
Total CHOL/HDL Ratio: 3.5 (calc) (ref <5.0)
Triglycerides: 141 mg/dL (ref <150)

## 2020-06-06 NOTE — Progress Notes (Signed)
Referring-Natalie Alexander, DO Reason for referral-hypertension  HPI: 59 year old female for evaluation of hypertension at request of Emeterio Reeve, DO.  Patient has had previous CVA.  MRI September 2020 showed small acute/subacute infarct left frontal centrum semiovale.  There was a remote lacunar infarct in the right putamen.  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  No high-grade arterial stenosis in the neck.  Echocardiogram September 2020 showed normal LV function, mild left ventricular hypertrophy, mild aortic insufficiency, mild tricuspid regurgitation.  Follow-up Zio patch monitor revealed no atrial fibrillation.  Approximately 2 weeks ago the patient had an episode for 3 days when she felt as though she may not be forming her words correctly and some tingling in her right fingers.  There was no loss of strength or sensation in her extremities.  She otherwise denies dyspnea, chest pain, palpitations or syncope.  Note her son is a Marine scientist.  When she had her symptoms he checked her blood pressure and her systolic was in the 60V.  He decreased her lisinopril HCT from 40/25-20/12.5 daily.  Prior to that she was having some dizziness with standing and this has resolved.  Current Outpatient Medications  Medication Sig Dispense Refill  . aspirin EC 81 MG tablet Take 1 tablet (81 mg total) by mouth daily. 90 tablet 3  . atorvastatin (LIPITOR) 40 MG tablet Take 1 tablet (40 mg total) by mouth daily. 90 tablet 3  . lisinopril-hydrochlorothiazide (ZESTORETIC) 20-12.5 MG tablet Take 2 tablets by mouth daily. (Patient taking differently: Take 1 tablet by mouth daily. Patient is taking one tablet daily.) 180 tablet 3  . naproxen (NAPROSYN) 500 MG tablet TAKE 1 TABLET BY MOUTH TWICE DAILY WITH A MEAL. TAKE EVERY DAY FOR ONE WEEK, THEN USE AS NEEDED AFTER THAT. 60 tablet 0   No current facility-administered medications for this  visit.    No Known Allergies   Past Medical History:  Diagnosis Date  . CVA (cerebral vascular accident) ([place])   . High cholesterol   . Hypertension     Past Surgical History:  Procedure Laterality Date  . CESAREAN SECTION      Social History   Socioeconomic History  . Marital status: Married    Spouse name: Not on file  . Number of children: 2  . Years of education: Not on file  . Highest education level: Not on file  Occupational History  . Not on file  Tobacco Use  . Smoking status: Never Smoker  . Smokeless tobacco: Never Used  Vaping Use  . Vaping Use: Never used  Substance and Sexual Activity  . Alcohol use: Never  . Drug use: Never  . Sexual activity: Not Currently    Partners: Male  Other Topics Concern  . Not on file  Social History Narrative  . Not on file   Social Determinants of Health   Financial Resource Strain:   . Difficulty of Paying Living Expenses:   Food Insecurity:   . Worried About Charity fundraiser in the Last Year:   . Arboriculturist in the Last Year:   Transportation Needs:   . Film/video editor (Medical):   Marland Kitchen Lack of Transportation (Non-Medical):   Physical Activity:   . Days of Exercise per Week:   . Minutes of Exercise per Session:   Stress:   . Feeling of Stress :   Social Connections:   . Frequency of  Communication with Friends and Family:   . Frequency of Social Gatherings with Friends and Family:   . Attends Religious Services:   . Active Member of Clubs or Organizations:   . Attends Banker Meetings:   Marland Kitchen Marital Status:   Intimate Partner Violence:   . Fear of Current or Ex-Partner:   . Emotionally Abused:   Marland Kitchen Physically Abused:   . Sexually Abused:     Family History  Problem Relation Age of Onset  . Dementia Mother   . Lung cancer Father   . Atrial fibrillation Sister     ROS: no fevers or chills, productive cough, hemoptysis, dysphasia, odynophagia, melena, hematochezia, dysuria,  hematuria, rash, seizure activity, orthopnea, PND, pedal edema, claudication. Remaining systems are negative.  Physical Exam:   Blood pressure 110/61, pulse 74, height 5' (1.524 m), weight 140 lb (63.5 kg), SpO2 98 %.  General:  Well developed/well nourished in NAD Skin warm/dry Patient not depressed No peripheral clubbing Back-normal HEENT-normal/normal eyelids Neck supple/normal carotid upstroke bilaterally; no bruits; no JVD; no thyromegaly chest - CTA/ normal expansion CV - RRR/normal S1 and S2; no murmurs, rubs or gallops;  PMI nondisplaced Abdomen -NT/ND, no HSM, no mass, + bowel sounds, no bruit 2+ femoral pulses, no bruits Ext-no edema, chords, 2+ DP Neuro-grossly nonfocal  ECG -March 29, 2020-sinus rhythm with no ST changes.  Personally reviewed  A/P  1 prior CVA-continue aspirin and statin.  Patient also with question TIA symptoms 2 weeks ago.  However her blood pressure was running low and certainly hypoperfusion could have contributed.  This has improved following decreasing her lisinopril HCT.  I will arrange a transesophageal echocardiogram to exclude source of embolus.  There is also a family history of atrial fibrillation.  We will arrange an implantable loop monitor to exclude this rhythm disturbance.  If atrial fibrillation found she would need anticoagulation long-term.  Note echocardiogram at outside facility showed normal LV function.  2 hypertension-blood pressure controlled.  Continue present medications.  3 hyperlipidemia-continue statin.  Olga Millers, MD

## 2020-06-06 NOTE — H&P (View-Only) (Signed)
  Referring-Claire Alexander, DO Reason for referral-hypertension  HPI: 58-year-old female for evaluation of hypertension at request of Claire Alexander, DO.  Patient has had previous CVA.  MRI September 2020 showed small acute/subacute infarct left frontal centrum semiovale.  There was a remote lacunar infarct in the right putamen.  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  No high-grade arterial stenosis in the neck.  Echocardiogram September 2020 showed normal LV function, mild left ventricular hypertrophy, mild aortic insufficiency, mild tricuspid regurgitation.  Follow-up Zio patch monitor revealed no atrial fibrillation.  Approximately 2 weeks ago the patient had an episode for 3 days when she felt as though she may not be forming her words correctly and some tingling in her right fingers.  There was no loss of strength or sensation in her extremities.  She otherwise denies dyspnea, chest pain, palpitations or syncope.  Note her son is a nurse.  When she had her symptoms he checked her blood pressure and her systolic was in the 90s.  He decreased her lisinopril HCT from 40/25-20/12.5 daily.  Prior to that she was having some dizziness with standing and this has resolved.  Current Outpatient Medications  Medication Sig Dispense Refill  . aspirin EC 81 MG tablet Take 1 tablet (81 mg total) by mouth daily. 90 tablet 3  . atorvastatin (LIPITOR) 40 MG tablet Take 1 tablet (40 mg total) by mouth daily. 90 tablet 3  . lisinopril-hydrochlorothiazide (ZESTORETIC) 20-12.5 MG tablet Take 2 tablets by mouth daily. (Patient taking differently: Take 1 tablet by mouth daily. Patient is taking one tablet daily.) 180 tablet 3  . naproxen (NAPROSYN) 500 MG tablet TAKE 1 TABLET BY MOUTH TWICE DAILY WITH A MEAL. TAKE EVERY DAY FOR ONE WEEK, THEN USE AS NEEDED AFTER THAT. 60 tablet 0   No current facility-administered medications for this  visit.    No Known Allergies   Past Medical History:  Diagnosis Date  . CVA (cerebral vascular accident) (HCC)   . High cholesterol   . Hypertension     Past Surgical History:  Procedure Laterality Date  . CESAREAN SECTION      Social History   Socioeconomic History  . Marital status: Married    Spouse name: Not on file  . Number of children: 2  . Years of education: Not on file  . Highest education level: Not on file  Occupational History  . Not on file  Tobacco Use  . Smoking status: Never Smoker  . Smokeless tobacco: Never Used  Vaping Use  . Vaping Use: Never used  Substance and Sexual Activity  . Alcohol use: Never  . Drug use: Never  . Sexual activity: Not Currently    Partners: Male  Other Topics Concern  . Not on file  Social History Narrative  . Not on file   Social Determinants of Health   Financial Resource Strain:   . Difficulty of Paying Living Expenses:   Food Insecurity:   . Worried About Running Out of Food in the Last Year:   . Ran Out of Food in the Last Year:   Transportation Needs:   . Lack of Transportation (Medical):   . Lack of Transportation (Non-Medical):   Physical Activity:   . Days of Exercise per Week:   . Minutes of Exercise per Session:   Stress:   . Feeling of Stress :   Social Connections:   . Frequency of   Communication with Friends and Family:   . Frequency of Social Gatherings with Friends and Family:   . Attends Religious Services:   . Active Member of Clubs or Organizations:   . Attends Banker Meetings:   Marland Kitchen Marital Status:   Intimate Partner Violence:   . Fear of Current or Ex-Partner:   . Emotionally Abused:   Marland Kitchen Physically Abused:   . Sexually Abused:     Family History  Problem Relation Age of Onset  . Dementia Mother   . Lung cancer Father   . Atrial fibrillation Sister     ROS: no fevers or chills, productive cough, hemoptysis, dysphasia, odynophagia, melena, hematochezia, dysuria,  hematuria, rash, seizure activity, orthopnea, PND, pedal edema, claudication. Remaining systems are negative.  Physical Exam:   Blood pressure 110/61, pulse 74, height 5' (1.524 m), weight 140 lb (63.5 kg), SpO2 98 %.  General:  Well developed/well nourished in NAD Skin warm/dry Patient not depressed No peripheral clubbing Back-normal HEENT-normal/normal eyelids Neck supple/normal carotid upstroke bilaterally; no bruits; no JVD; no thyromegaly chest - CTA/ normal expansion CV - RRR/normal S1 and S2; no murmurs, rubs or gallops;  PMI nondisplaced Abdomen -NT/ND, no HSM, no mass, + bowel sounds, no bruit 2+ femoral pulses, no bruits Ext-no edema, chords, 2+ DP Neuro-grossly nonfocal  ECG -March 29, 2020-sinus rhythm with no ST changes.  Personally reviewed  A/P  1 prior CVA-continue aspirin and statin.  Patient also with question TIA symptoms 2 weeks ago.  However her blood pressure was running low and certainly hypoperfusion could have contributed.  This has improved following decreasing her lisinopril HCT.  I will arrange a transesophageal echocardiogram to exclude source of embolus.  There is also a family history of atrial fibrillation.  We will arrange an implantable loop monitor to exclude this rhythm disturbance.  If atrial fibrillation found she would need anticoagulation long-term.  Note echocardiogram at outside facility showed normal LV function.  2 hypertension-blood pressure controlled.  Continue present medications.  3 hyperlipidemia-continue statin.  Claire Millers, MD

## 2020-06-08 ENCOUNTER — Other Ambulatory Visit: Payer: Self-pay | Admitting: *Deleted

## 2020-06-08 ENCOUNTER — Other Ambulatory Visit: Payer: Self-pay

## 2020-06-08 ENCOUNTER — Ambulatory Visit: Payer: BLUE CROSS/BLUE SHIELD | Admitting: Cardiology

## 2020-06-08 ENCOUNTER — Encounter: Payer: Self-pay | Admitting: Cardiology

## 2020-06-08 VITALS — BP 110/61 | HR 74 | Ht 60.0 in | Wt 140.0 lb

## 2020-06-08 DIAGNOSIS — I10 Essential (primary) hypertension: Secondary | ICD-10-CM | POA: Diagnosis not present

## 2020-06-08 DIAGNOSIS — I639 Cerebral infarction, unspecified: Secondary | ICD-10-CM

## 2020-06-08 DIAGNOSIS — E782 Mixed hyperlipidemia: Secondary | ICD-10-CM | POA: Diagnosis not present

## 2020-06-08 NOTE — Patient Instructions (Signed)
  You are scheduled for a TEE on 06-14-20 with Dr. Duke Salvia.  Please arrive at the [facility] (Main Entrance A) at [facility]: [address] [place], [place] [address] at 9:30 AM. (1 hour prior to procedure unless lab work is needed; if lab work is needed arrive 1.5 hours ahead)  DIET: Nothing to eat or drink after midnight except a sip of water with medications (see medication instructions below)  Medication Instructions:  TAKE ALL MEDICATIONS WITH SIPS OF WATER  You must have a responsible person to drive you home and stay in the waiting area during your procedure. Failure to do so could result in cancellation.  Bring your insurance cards.  *Special Note: Every effort is made to have your procedure done on time. Occasionally there are emergencies that occur at the hospital that may cause delays. Please be patient if a delay does occur.    Your physician recommends that you schedule a follow-up appointment in: 3 MONTHS WITH DR CRENSHAW  REFERRAL TO ELECTROPHYSIOLOGY FOR LOOP RECORDER

## 2020-06-11 ENCOUNTER — Other Ambulatory Visit (HOSPITAL_COMMUNITY)
Admission: RE | Admit: 2020-06-11 | Discharge: 2020-06-11 | Disposition: A | Discharge status: Home / Self Care | Payer: BLUE CROSS/BLUE SHIELD | Source: Ambulatory Visit | Attending: Cardiovascular Disease | Admitting: Cardiovascular Disease

## 2020-06-11 DIAGNOSIS — Z20822 Contact with and (suspected) exposure to covid-19: Secondary | ICD-10-CM | POA: Diagnosis not present

## 2020-06-11 DIAGNOSIS — Z01812 Encounter for preprocedural laboratory examination: Secondary | ICD-10-CM | POA: Insufficient documentation

## 2020-06-11 LAB — SARS CORONAVIRUS 2 (TAT 6-24 HRS): SARS Coronavirus 2: NEGATIVE

## 2020-06-14 ENCOUNTER — Encounter (HOSPITAL_COMMUNITY)
Admission: RE | Disposition: A | Discharge status: Home / Self Care | Payer: Self-pay | Source: Home / Self Care | Attending: Cardiovascular Disease

## 2020-06-14 ENCOUNTER — Ambulatory Visit (HOSPITAL_COMMUNITY): Payer: BLUE CROSS/BLUE SHIELD | Admitting: Anesthesiology

## 2020-06-14 ENCOUNTER — Other Ambulatory Visit: Payer: Self-pay

## 2020-06-14 ENCOUNTER — Ambulatory Visit (HOSPITAL_BASED_OUTPATIENT_CLINIC_OR_DEPARTMENT_OTHER)
Admission: RE | Admit: 2020-06-14 | Discharge: 2020-06-14 | Disposition: A | Discharge status: Home / Self Care | Payer: BLUE CROSS/BLUE SHIELD | Source: Ambulatory Visit | Attending: Cardiovascular Disease | Admitting: Cardiovascular Disease

## 2020-06-14 ENCOUNTER — Encounter (HOSPITAL_COMMUNITY): Payer: Self-pay | Admitting: Cardiovascular Disease

## 2020-06-14 ENCOUNTER — Ambulatory Visit (HOSPITAL_COMMUNITY)
Admission: RE | Admit: 2020-06-14 | Discharge: 2020-06-14 | Disposition: A | Discharge status: Home / Self Care | Payer: BLUE CROSS/BLUE SHIELD | Attending: Cardiovascular Disease | Admitting: Cardiovascular Disease

## 2020-06-14 DIAGNOSIS — I10 Essential (primary) hypertension: Secondary | ICD-10-CM | POA: Diagnosis present

## 2020-06-14 DIAGNOSIS — Z8249 Family history of ischemic heart disease and other diseases of the circulatory system: Secondary | ICD-10-CM | POA: Insufficient documentation

## 2020-06-14 DIAGNOSIS — E7800 Pure hypercholesterolemia, unspecified: Secondary | ICD-10-CM | POA: Diagnosis not present

## 2020-06-14 DIAGNOSIS — I340 Nonrheumatic mitral (valve) insufficiency: Secondary | ICD-10-CM

## 2020-06-14 DIAGNOSIS — Z7982 Long term (current) use of aspirin: Secondary | ICD-10-CM | POA: Diagnosis not present

## 2020-06-14 DIAGNOSIS — Z79899 Other long term (current) drug therapy: Secondary | ICD-10-CM | POA: Insufficient documentation

## 2020-06-14 DIAGNOSIS — I352 Nonrheumatic aortic (valve) stenosis with insufficiency: Secondary | ICD-10-CM | POA: Diagnosis not present

## 2020-06-14 DIAGNOSIS — I350 Nonrheumatic aortic (valve) stenosis: Secondary | ICD-10-CM

## 2020-06-14 DIAGNOSIS — I639 Cerebral infarction, unspecified: Secondary | ICD-10-CM

## 2020-06-14 DIAGNOSIS — E785 Hyperlipidemia, unspecified: Secondary | ICD-10-CM | POA: Diagnosis not present

## 2020-06-14 DIAGNOSIS — Z8673 Personal history of transient ischemic attack (TIA), and cerebral infarction without residual deficits: Secondary | ICD-10-CM | POA: Insufficient documentation

## 2020-06-14 HISTORY — PX: BUBBLE STUDY: SHX6837

## 2020-06-14 HISTORY — PX: TEE WITHOUT CARDIOVERSION: SHX5443

## 2020-06-14 LAB — CBC
HCT: 45.2 % (ref 36.0–46.0)
Hemoglobin: 14.7 g/dL (ref 12.0–15.0)
MCH: 27.9 pg (ref 26.0–34.0)
MCHC: 32.5 g/dL (ref 30.0–36.0)
MCV: 85.8 fL (ref 80.0–100.0)
Platelets: 333 10*3/uL (ref 150–400)
RBC: 5.27 MIL/uL — ABNORMAL HIGH (ref 3.87–5.11)
RDW: 12.4 % (ref 11.5–15.5)
WBC: 9 10*3/uL (ref 4.0–10.5)
nRBC: 0 % (ref 0.0–0.2)

## 2020-06-14 LAB — SEDIMENTATION RATE: Sed Rate: 3 mm/hr (ref 0–22)

## 2020-06-14 SURGERY — ECHOCARDIOGRAM, TRANSESOPHAGEAL
Anesthesia: Monitor Anesthesia Care

## 2020-06-14 MED ORDER — BUTAMBEN-TETRACAINE-BENZOCAINE 2-2-14 % EX AERO
INHALATION_SPRAY | CUTANEOUS | Status: DC | PRN
  Administered 2020-06-14 (×2): 1 via TOPICAL

## 2020-06-14 MED ORDER — PROPOFOL 500 MG/50ML IV EMUL
INTRAVENOUS | Status: DC | PRN
  Administered 2020-06-14: 100 ug/kg/min via INTRAVENOUS

## 2020-06-14 MED ORDER — SODIUM CHLORIDE 0.9 % IV SOLN: INTRAVENOUS | Status: DC

## 2020-06-14 MED ORDER — LIDOCAINE HCL (CARDIAC) PF 100 MG/5ML IV SOSY
PREFILLED_SYRINGE | INTRAVENOUS | Status: DC | PRN
  Administered 2020-06-14: 50 mg via INTRAVENOUS

## 2020-06-14 MED ORDER — PROPOFOL 10 MG/ML IV BOLUS
INTRAVENOUS | Status: DC | PRN
  Administered 2020-06-14: 20 mg via INTRAVENOUS

## 2020-06-14 NOTE — Transfer of Care (Signed)
Immediate Anesthesia Transfer of Care Note  Patient: Claire Marshall  Procedure(s) Performed: TRANSESOPHAGEAL ECHOCARDIOGRAM (TEE) (N/A ) BUBBLE STUDY  Patient Location: Endoscopy Unit  Anesthesia Type:MAC  Level of Consciousness: awake, alert  and oriented  Airway & Oxygen Therapy: Patient Spontanous Breathing and Patient connected to nasal cannula oxygen  Post-op Assessment: Report given to RN and Post -op Vital signs reviewed and stable  Post vital signs: Reviewed and stable  Last Vitals:  Vitals Value Taken Time  BP 112/65 06/14/20 1116  Temp    Pulse 66 06/14/20 1123  Resp 15 06/14/20 1123  SpO2 99 % 06/14/20 1123  Vitals shown include unvalidated device data.  Last Pain:  Vitals:   06/14/20 1123  TempSrc:   PainSc: (P) 0-No pain         Complications: No complications documented.

## 2020-06-14 NOTE — Progress Notes (Signed)
  Echocardiogram Echocardiogram Transesophageal has been performed.  Stark Bray Swaim 06/14/2020, 11:20 AM

## 2020-06-14 NOTE — Discharge Instructions (Signed)

## 2020-06-14 NOTE — CV Procedure (Addendum)
Brief TEE Note  LVEF >55% Mild MR with two jets Trivial tricuspid regurgitation Thickening of the aortic valve leaflets Mild aortic stenosis and mild aortic regurgitation Negative for ASD and PFO by color flow Doppler and saline microcavitation study.  For additional details see full report.  Will send the following labs to evaluate for endocarditis: ESR, CBC, blood cultures x2.  Tniya Bowditch C. Oval Linsey, MD, [facility] 06/14/2020 11:12 AM

## 2020-06-14 NOTE — Interval H&P Note (Signed)
History and Physical Interval Note:  06/14/2020 10:48 AM  Claire Marshall  has presented today for surgery, with the diagnosis of STROKE.  The various methods of treatment have been discussed with the patient and family. After consideration of risks, benefits and other options for treatment, the patient has consented to  Procedure(s): TRANSESOPHAGEAL ECHOCARDIOGRAM (TEE) (N/A) as a surgical intervention.  The patient's history has been reviewed, patient examined, no change in status, stable for surgery.  I have reviewed the patient's chart and labs.  Questions were answered to the patient's satisfaction.     Chilton Si, MD

## 2020-06-14 NOTE — Anesthesia Preprocedure Evaluation (Signed)
Anesthesia Evaluation  Patient identified by MRN, date of birth, ID band Patient awake    Reviewed: Allergy & Precautions, NPO status , Patient's Chart, lab work & pertinent test results  Airway Mallampati: II  TM Distance: >3 FB Neck ROM: Full    Dental  (+) Teeth Intact, Dental Advisory Given   Pulmonary neg pulmonary ROS,    Pulmonary exam normal breath sounds clear to auscultation       Cardiovascular hypertension, Pt. on medications Normal cardiovascular exam Rhythm:Regular Rate:Normal     Neuro/Psych CVA    GI/Hepatic negative GI ROS, Neg liver ROS,   Endo/Other  negative endocrine ROS  Renal/GU negative Renal ROS     Musculoskeletal negative musculoskeletal ROS (+)   Abdominal   Peds  Hematology negative hematology ROS (+)   Anesthesia Other Findings Day of surgery medications reviewed with the patient.  Reproductive/Obstetrics                             Anesthesia Physical Anesthesia Plan  ASA: III  Anesthesia Plan: MAC   Post-op Pain Management:    Induction: Intravenous  PONV Risk Score and Plan: 2 and Propofol infusion and Treatment may vary due to age or medical condition  Airway Management Planned: Nasal Cannula and Natural Airway  Additional Equipment:   Intra-op Plan:   Post-operative Plan:   Informed Consent: I have reviewed the patients History and Physical, chart, labs and discussed the procedure including the risks, benefits and alternatives for the proposed anesthesia with the patient or authorized representative who has indicated his/her understanding and acceptance.     Dental advisory given  Plan Discussed with: CRNA and Anesthesiologist  Anesthesia Plan Comments: (Discussed risks/benefits/alternatives to MAC sedation including need for ventilatory support, hypotension, need for conversion to general anesthesia.  All patient questions answered.   Patient/guardian wishes to proceed.)        Anesthesia Quick Evaluation

## 2020-06-14 NOTE — Anesthesia Procedure Notes (Signed)
Procedure Name: MAC Date/Time: 06/14/2020 10:37 AM Performed by: Mariea Clonts, CRNA Pre-anesthesia Checklist: Patient identified, Emergency Drugs available, Suction available, Timeout performed and Patient being monitored Patient Re-evaluated:Patient Re-evaluated prior to induction Oxygen Delivery Method: Nasal cannula Preoxygenation: Pre-oxygenation with 100% oxygen

## 2020-06-14 NOTE — Anesthesia Postprocedure Evaluation (Signed)
Anesthesia Post Note  Patient: Claire Marshall  Procedure(s) Performed: TRANSESOPHAGEAL ECHOCARDIOGRAM (TEE) (N/A ) BUBBLE STUDY     Patient location during evaluation: Endoscopy Anesthesia Type: MAC Level of consciousness: awake and alert Pain management: pain level controlled Vital Signs Assessment: post-procedure vital signs reviewed and stable Respiratory status: spontaneous breathing, nonlabored ventilation, respiratory function stable and patient connected to nasal cannula oxygen Cardiovascular status: stable and blood pressure returned to baseline Postop Assessment: no apparent nausea or vomiting Anesthetic complications: no   No complications documented.  Last Vitals:  Vitals:   06/14/20 1200 06/14/20 1206  BP: (!) 166/60 (!) 153/69  Pulse: 63 63  Resp: 19 19  Temp:    SpO2: 98% 98%    Last Pain:  Vitals:   06/14/20 1206  TempSrc:   PainSc: 0-No pain                 Catalina Gravel

## 2020-06-15 ENCOUNTER — Encounter (HOSPITAL_COMMUNITY): Payer: Self-pay | Admitting: Cardiovascular Disease

## 2020-06-19 LAB — CULTURE, BLOOD (ROUTINE X 2)
Culture: NO GROWTH
Culture: NO GROWTH
Special Requests: ADEQUATE
Special Requests: ADEQUATE

## 2020-06-23 ENCOUNTER — Telehealth: Payer: Self-pay

## 2020-06-23 NOTE — Telephone Encounter (Signed)
[facility] pharmacy needs clarification on the atorvastatin and Simvastatin prescriptions. The last note  Says to stay on atorvas but simvastatin was sent in.

## 2020-06-24 NOTE — Telephone Encounter (Signed)
[facility] pharmacy contacted. RX updated to atorvastatin. Pharmacy advised if there were more questions to contact patient's cardiologist.

## 2020-06-24 NOTE — Telephone Encounter (Signed)
Looks like patient has seen cardiology since her last visit w/ me Pharmacy should contact Dr Biagio Quint (cardiology) iif any questions, but looks like they want patient on atorvastatin and they simvastatin needs to be cancelled

## 2020-06-29 IMAGING — DX DG FOOT COMPLETE 3+V*R*
3 series · 3 of 3 positions shown · non-contrast
Comparison: 01/04/2020

CLINICAL DATA: Evaluate calcaneal fracture.

EXAM:
RIGHT FOOT COMPLETE - 3+ VIEW

[foot ap]
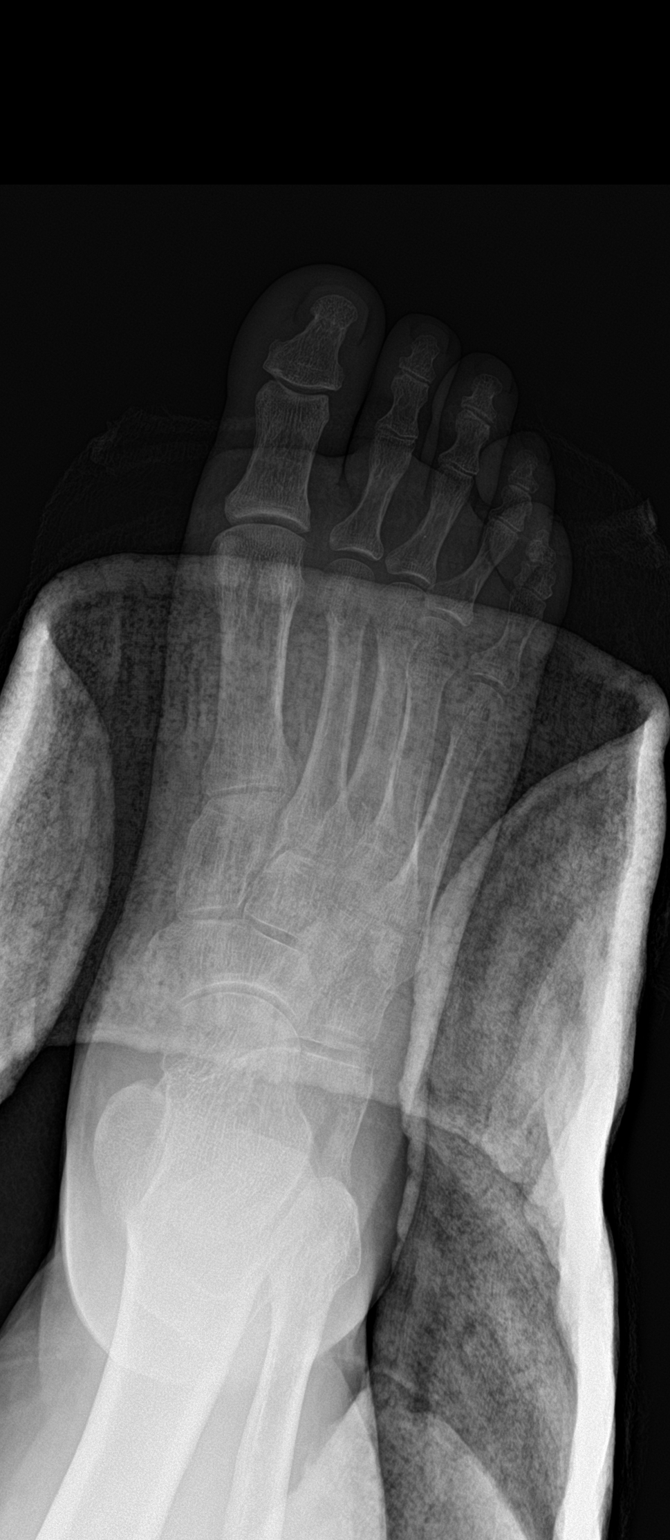

[foot obl]
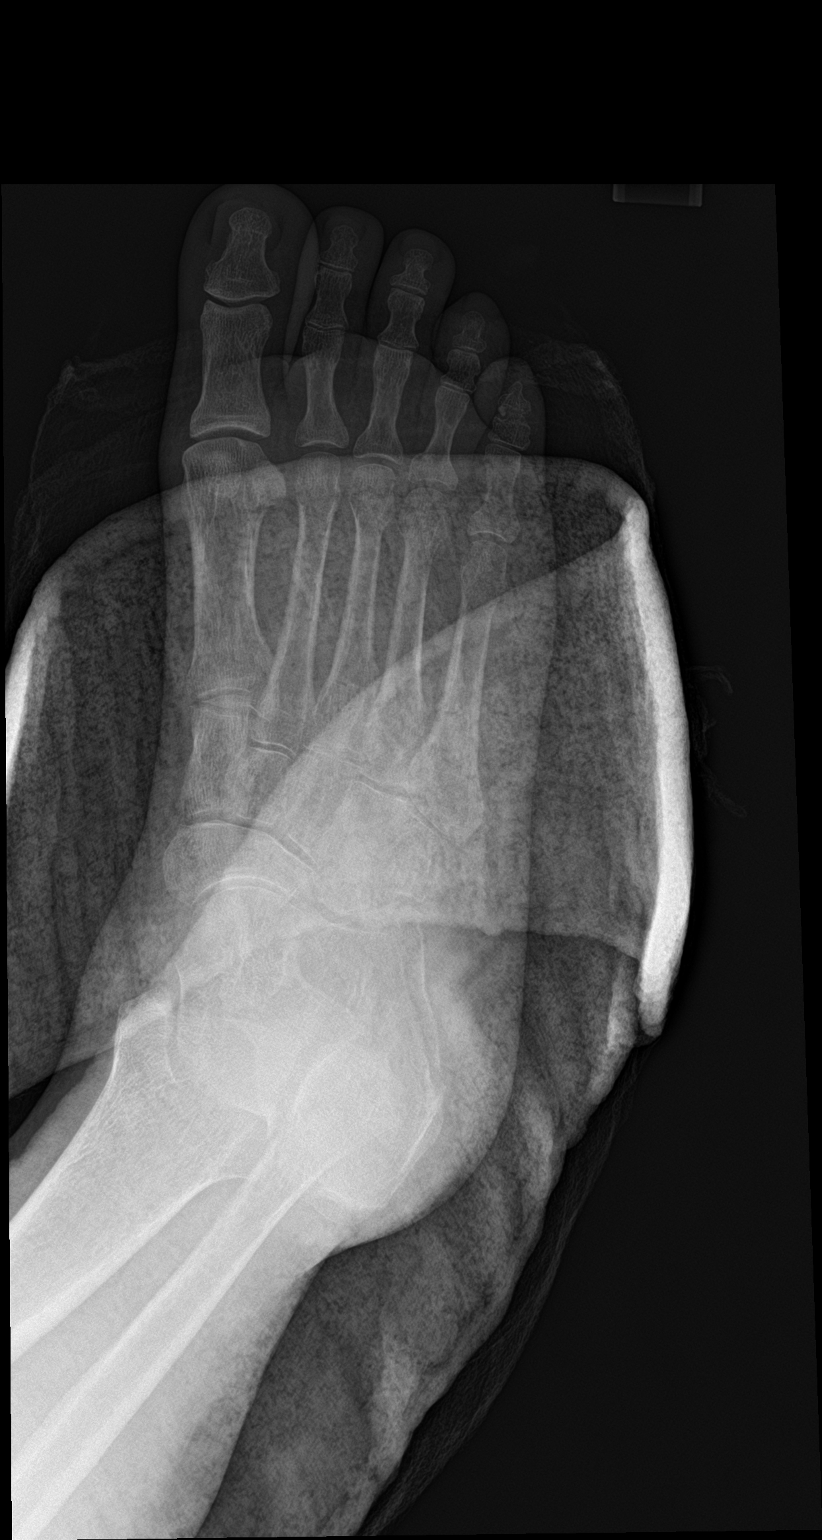

[foot lat]
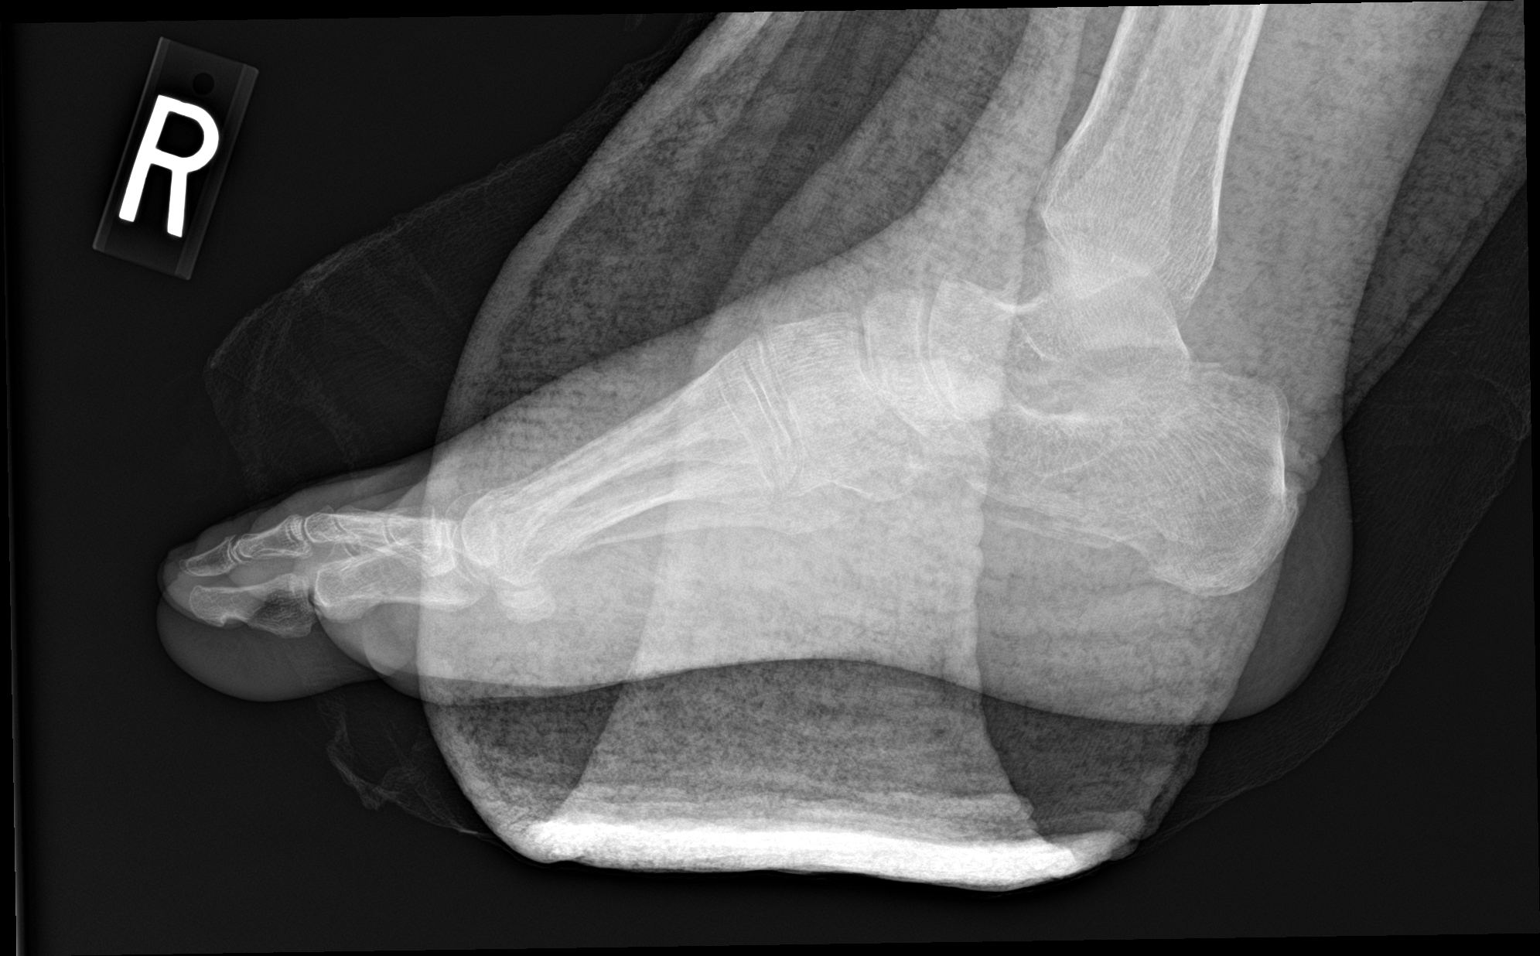

[3 of 3 positions shown; findings below may reference images not displayed]

FINDINGS: Examination is degraded secondary to overlying casting material.

Redemonstrated comminuted calcaneal fracture which appears grossly
unchanged in alignment given overlying casting material.

Potential cortical step-off involving the proximal metaphysis of the
third and fifth metatarsals could be artifactual due to overlying
material though nondisplaced fractures could have a similar
appearance.

Expected adjacent soft tissue swelling.  No radiopaque foreign body.

Joint spaces are preserved.  No significant hallux valgus deformity.
IMPRESSION: Grossly unchanged appearance and alignment of known comminuted
calcaneal fracture. Additional fractures involving the proximal
metaphysis of the third and fifth metatarsals is not excluded though
could be artifactual due to overlying casting material. Repeat
radiographs without casting material or alternatively, as previously
suggested, further evaluation with ankle/foot CT could be performed
as indicated.

## 2020-07-18 ENCOUNTER — Telehealth: Payer: Self-pay

## 2020-07-18 NOTE — Telephone Encounter (Signed)
Outreach made to Claire Marshall's family, spoke with son.  Per son they are working to establish Claire Marshall with [facility] because [facility] is out of network for Claire Marshall.  Per son cancel appt with Dr. Elberta Fortis scheduled for 07/19/20.  Advised would keep upcoming appt with Dr. Jens Som until Claire Marshall can establish at [facility].  Advised to call and cancel further appts at that time.  Claire Marshall's son indicates understanding.

## 2020-07-19 ENCOUNTER — Institutional Professional Consult (permissible substitution): Payer: BLUE CROSS/BLUE SHIELD | Admitting: Cardiology

## 2020-09-05 NOTE — Progress Notes (Deleted)
HPI: FU hypertension. Patient has had previous CVA.  MRI September 2020 showed small acute/subacute infarct left frontal centrum semiovale.  There was a remote lacunar infarct in the right putamen.  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  No high-grade arterial stenosis in the neck. Echocardiogram September 2020 showed normal LV function, mild left ventricular hypertrophy, mild aortic insufficiency, mild tricuspid regurgitation.  Follow-up Zio patch monitor revealed no atrial fibrillation. TEE 6/21 showed normal LV function, mild MR, mild AI, mild AS (mean gradient 10 mmHg) and negative saline microcavitation study. Since last seen,   Current Outpatient Medications  Medication Sig Dispense Refill  . aspirin EC 81 MG tablet Take 1 tablet (81 mg total) by mouth daily. (Patient taking differently: Take 81 mg by mouth at bedtime. ) 90 tablet 3  . atorvastatin (LIPITOR) 40 MG tablet Take 1 tablet (40 mg total) by mouth daily. 90 tablet 3  . lisinopril (ZESTRIL) 5 MG tablet Take 5 mg by mouth daily.    . meclizine (ANTIVERT) 25 MG tablet Take 25 mg by mouth 3 (three) times daily as needed for dizziness.    . naproxen (NAPROSYN) 500 MG tablet TAKE 1 TABLET BY MOUTH TWICE DAILY WITH A MEAL. TAKE EVERY DAY FOR ONE WEEK, THEN USE AS NEEDED AFTER THAT. (Patient taking differently: Take 500 mg by mouth daily as needed for headache. ) 60 tablet 0   No current facility-administered medications for this visit.     Past Medical History:  Diagnosis Date  . CVA (cerebral vascular accident) (HCC)   . High cholesterol   . Hypertension     Past Surgical History:  Procedure Laterality Date  . BUBBLE STUDY  06/14/2020   Procedure: BUBBLE STUDY;  Surgeon: Chilton Si, MD;  Location: [facility] ENDOSCOPY;  Service: Cardiovascular;;  . CESAREAN SECTION    . TEE WITHOUT CARDIOVERSION N/A 06/14/2020   Procedure: TRANSESOPHAGEAL  ECHOCARDIOGRAM (TEE);  Surgeon: Chilton Si, MD;  Location: [facility] ENDOSCOPY;  Service: Cardiovascular;  Laterality: N/A;    Social History   Socioeconomic History  . Marital status: Married    Spouse name: Not on file  . Number of children: 2  . Years of education: Not on file  . Highest education level: Not on file  Occupational History  . Not on file  Tobacco Use  . Smoking status: Never Smoker  . Smokeless tobacco: Never Used  Vaping Use  . Vaping Use: Never used  Substance and Sexual Activity  . Alcohol use: Never  . Drug use: Never  . Sexual activity: Not Currently    Partners: Male  Other Topics Concern  . Not on file  Social History Narrative  . Not on file   Social Determinants of Health   Financial Resource Strain:   . Difficulty of Paying Living Expenses: Not on file  Food Insecurity:   . Worried About Programme researcher, broadcasting/film/video in the Last Year: Not on file  . Ran Out of Food in the Last Year: Not on file  Transportation Needs:   . Lack of Transportation (Medical): Not on file  . Lack of Transportation (Non-Medical): Not on file  Physical Activity:   . Days of Exercise per Week: Not on file  . Minutes of Exercise per Session: Not on file  Stress:   . Feeling of Stress : Not on file  Social Connections:   . Frequency of Communication with  Friends and Family: Not on file  . Frequency of Social Gatherings with Friends and Family: Not on file  . Attends Religious Services: Not on file  . Active Member of Clubs or Organizations: Not on file  . Attends Banker Meetings: Not on file  . Marital Status: Not on file  Intimate Partner Violence:   . Fear of Current or Ex-Partner: Not on file  . Emotionally Abused: Not on file  . Physically Abused: Not on file  . Sexually Abused: Not on file    Family History  Problem Relation Age of Onset  . Dementia Mother   . Lung cancer Father   . Atrial fibrillation Sister     ROS: no fevers or chills,  productive cough, hemoptysis, dysphasia, odynophagia, melena, hematochezia, dysuria, hematuria, rash, seizure activity, orthopnea, PND, pedal edema, claudication. Remaining systems are negative.  Physical Exam: Well-developed well-nourished in no acute distress.  Skin is warm and dry.  HEENT is normal.  Neck is supple.  Chest is clear to auscultation with normal expansion.  Cardiovascular exam is regular rate and rhythm.  Abdominal exam nontender or distended. No masses palpated. Extremities show no edema. neuro grossly intact  ECG- personally reviewed  A/P  1 prior CVA-plan to continue aspirin and statin.  Symptoms improved after decreasing lisinopril HCT and there may have been a component of hypoperfusion contributing to symptoms.  Transesophageal echocardiogram showed no embolic source.  She was scheduled to see electrophysiology for possible implantable loop monitor to exclude atrial fibrillation but [facility] is out of her network and therefore she would like to be seen at [facility].  2 hypertension-patient's blood pressure is controlled today.  Continue present medications.  3 hyperlipidemia-continue statin.  Olga Millers, MD

## 2020-09-14 ENCOUNTER — Ambulatory Visit: Payer: BLUE CROSS/BLUE SHIELD | Admitting: Cardiology

## 2020-09-15 IMAGING — DX DG CERVICAL SPINE 2 OR 3 VIEWS
3 series · 3 of 3 positions shown · non-contrast
Comparison: None.

CLINICAL DATA: Numbness and tingling in the left arm

EXAM:
CERVICAL SPINE - 2-3 VIEW

[c-spine lat]
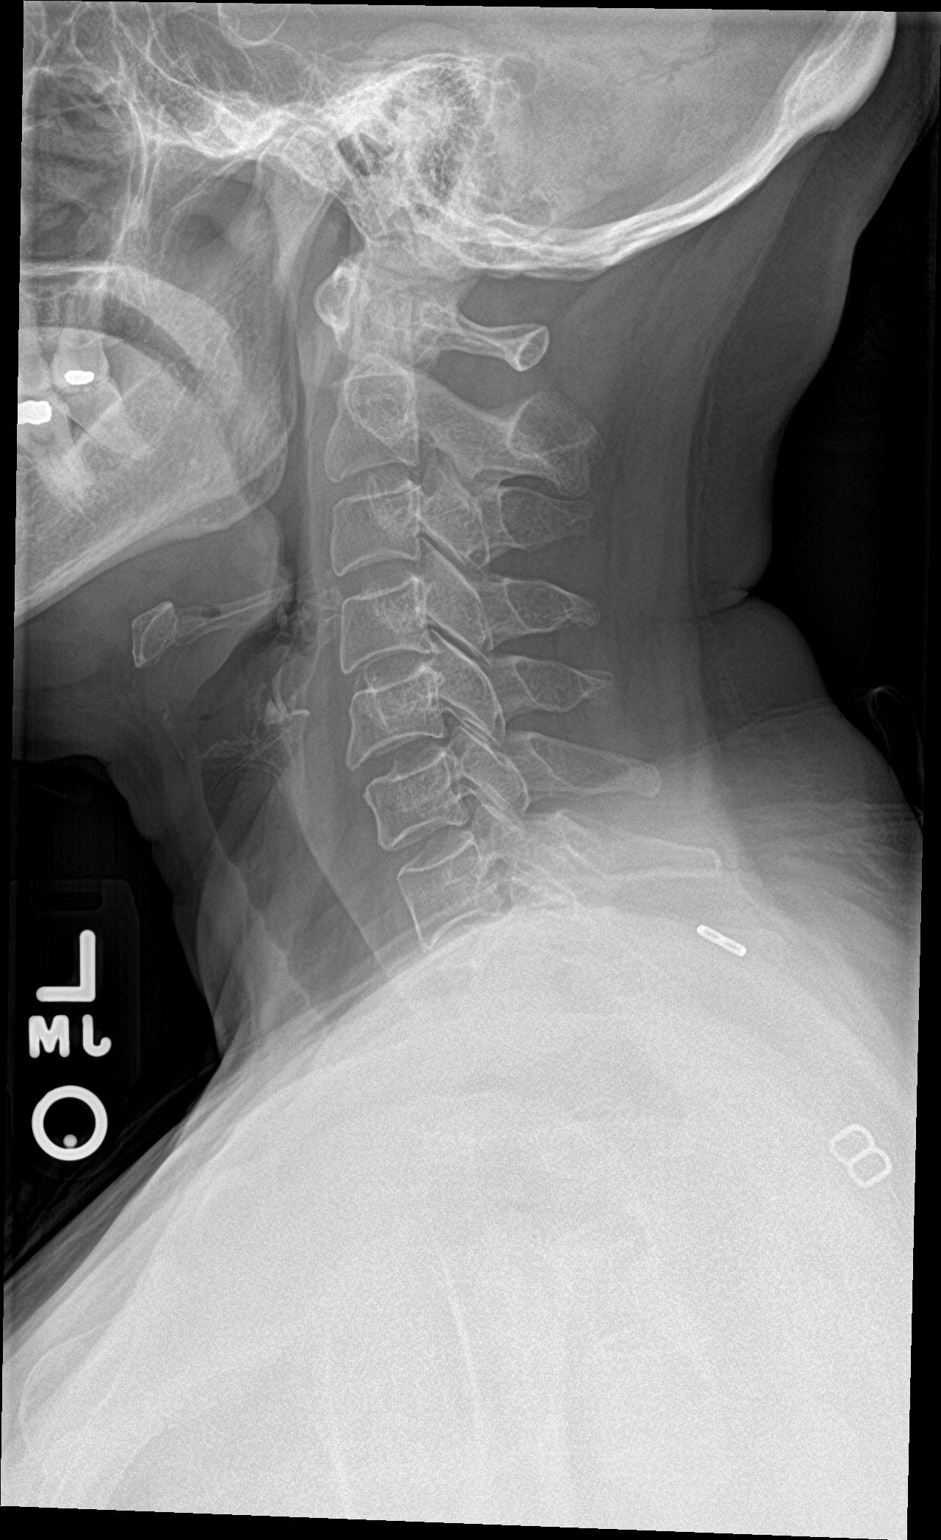

[c-spine ap]
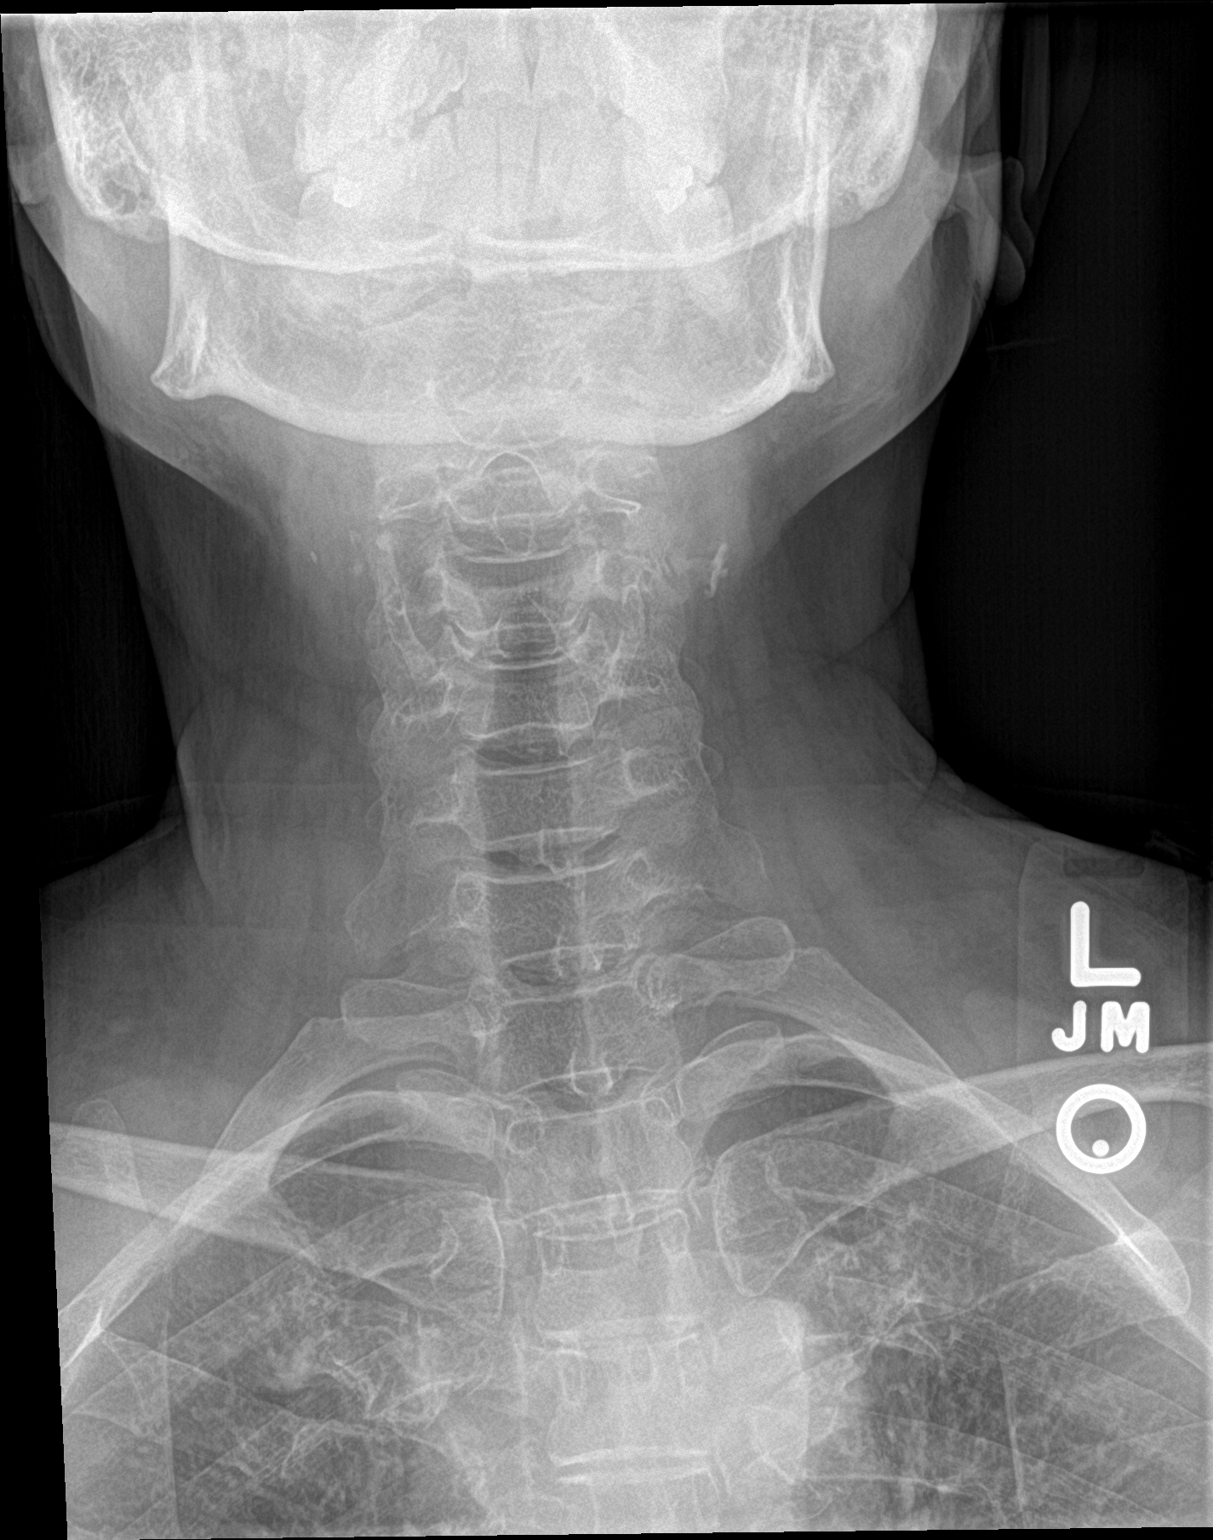

[c-spine open mouth]
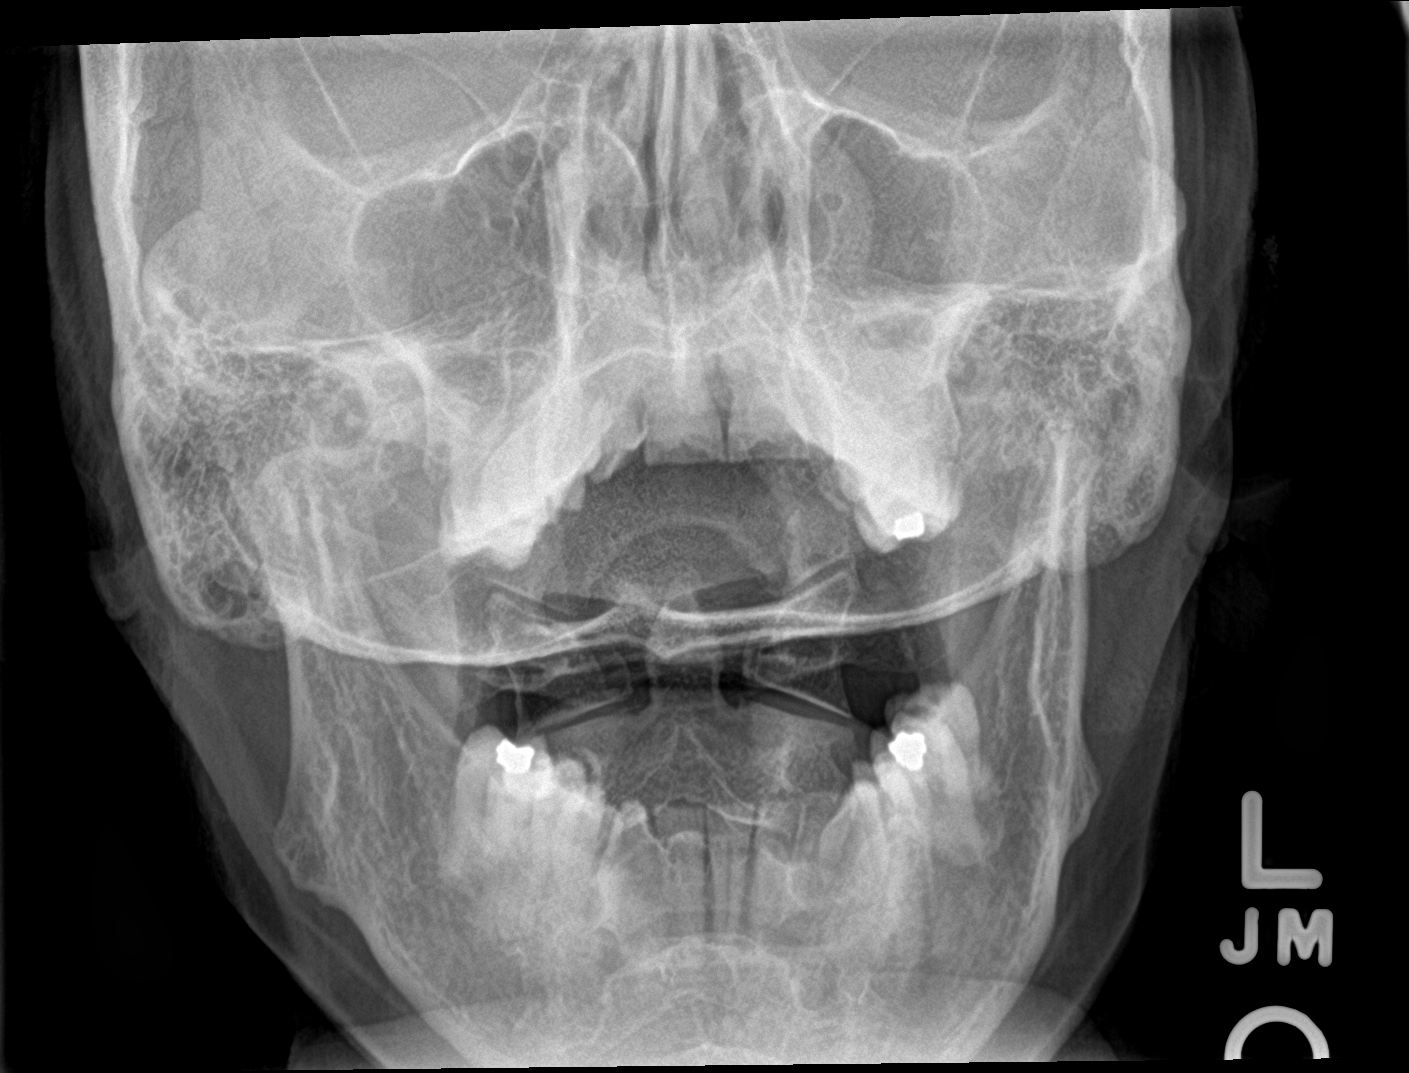

[3 of 3 positions shown; findings below may reference images not displayed]

FINDINGS: There is no evidence of cervical spine fracture or prevertebral soft
tissue swelling. Alignment is normal. No other significant bone
abnormalities are identified. Bilateral carotid artery
atherosclerosis.
IMPRESSION: Negative cervical spine radiographs.
